# Patient Record
Sex: Male | Born: 1950 | Race: White | Hispanic: Yes | Marital: Single | Smoking: Former smoker
Health system: Southern US, Community
[De-identification: ages and names within clinical notes are randomized; demographics above are authoritative.]

## PROBLEM LIST (undated history)

## (undated) DIAGNOSIS — K759 Inflammatory liver disease, unspecified: Secondary | ICD-10-CM

## (undated) DIAGNOSIS — K7460 Unspecified cirrhosis of liver: Secondary | ICD-10-CM

## (undated) DIAGNOSIS — M47812 Spondylosis without myelopathy or radiculopathy, cervical region: Secondary | ICD-10-CM

## (undated) DIAGNOSIS — M1990 Unspecified osteoarthritis, unspecified site: Secondary | ICD-10-CM

## (undated) DIAGNOSIS — I10 Essential (primary) hypertension: Secondary | ICD-10-CM

## (undated) HISTORY — DX: Essential (primary) hypertension: I10

## (undated) HISTORY — PX: FEMUR FRACTURE SURGERY: SHX633

---

## 2001-10-12 ENCOUNTER — Emergency Department (HOSPITAL_COMMUNITY): Admission: EM | Admit: 2001-10-12 | Discharge: 2001-10-12 | Payer: Self-pay | Admitting: Emergency Medicine

## 2001-10-12 ENCOUNTER — Encounter: Payer: Self-pay | Admitting: Emergency Medicine

## 2001-10-22 ENCOUNTER — Emergency Department (HOSPITAL_COMMUNITY): Admission: EM | Admit: 2001-10-22 | Discharge: 2001-10-22 | Payer: Self-pay

## 2009-01-26 ENCOUNTER — Inpatient Hospital Stay (HOSPITAL_COMMUNITY): Admission: EM | Admit: 2009-01-26 | Discharge: 2009-01-30 | Payer: Self-pay | Admitting: Emergency Medicine

## 2009-09-10 ENCOUNTER — Ambulatory Visit (HOSPITAL_COMMUNITY): Admission: RE | Admit: 2009-09-10 | Discharge: 2009-09-10 | Payer: Self-pay | Admitting: Specialist

## 2011-02-17 LAB — URINALYSIS, ROUTINE W REFLEX MICROSCOPIC
Bilirubin Urine: NEGATIVE
Glucose, UA: NEGATIVE mg/dL
Glucose, UA: NEGATIVE mg/dL
Hgb urine dipstick: NEGATIVE
Ketones, ur: NEGATIVE mg/dL
Ketones, ur: NEGATIVE mg/dL
Nitrite: POSITIVE — AB
Protein, ur: NEGATIVE mg/dL
pH: 6 (ref 5.0–8.0)
pH: 6 (ref 5.0–8.0)

## 2011-02-17 LAB — CBC
HCT: 38.6 % — ABNORMAL LOW (ref 39.0–52.0)
MCV: 94.9 fL (ref 78.0–100.0)
RBC: 4.07 MIL/uL — ABNORMAL LOW (ref 4.22–5.81)
WBC: 6 10*3/uL (ref 4.0–10.5)

## 2011-02-17 LAB — COMPREHENSIVE METABOLIC PANEL
AST: 39 U/L — ABNORMAL HIGH (ref 0–37)
Albumin: 3.3 g/dL — ABNORMAL LOW (ref 3.5–5.2)
Alkaline Phosphatase: 84 U/L (ref 39–117)
CO2: 30 mEq/L (ref 19–32)
Chloride: 107 mEq/L (ref 96–112)
Creatinine, Ser: 0.73 mg/dL (ref 0.4–1.5)
GFR calc Af Amer: >60 mL/min (ref >60)
GFR calc non Af Amer: >60 mL/min (ref >60)
Potassium: 4.9 mEq/L (ref 3.5–5.1)
Total Bilirubin: 0.4 mg/dL (ref 0.3–1.2)

## 2011-02-17 LAB — DIFFERENTIAL
Basophils Absolute: 0 10*3/uL (ref 0.0–0.1)
Basophils Relative: 0 % (ref 0–1)
Eosinophils Absolute: 0.1 10*3/uL (ref 0.0–0.7)
Eosinophils Relative: 2 % (ref 0–5)
Lymphocytes Relative: 26 % (ref 12–46)
Monocytes Absolute: 0.5 10*3/uL (ref 0.1–1.0)

## 2011-02-17 LAB — PROTIME-INR: Prothrombin Time: 12.5 seconds (ref 11.6–15.2)

## 2011-02-17 LAB — URINE MICROSCOPIC-ADD ON

## 2011-02-17 LAB — URINE CULTURE: Colony Count: >=100000

## 2011-02-17 LAB — TYPE AND SCREEN
ABO/RH(D): O POS
Antibody Screen: NEGATIVE

## 2011-02-17 LAB — ABO/RH: ABO/RH(D): O POS

## 2011-02-24 LAB — CBC
HCT: 28.6 % — ABNORMAL LOW (ref 39.0–52.0)
HCT: 31.6 % — ABNORMAL LOW (ref 39.0–52.0)
Hemoglobin: 12.6 g/dL — ABNORMAL LOW (ref 13.0–17.0)
MCHC: 36 g/dL (ref 30.0–36.0)
MCV: 93.5 fL (ref 78.0–100.0)
MCV: 94.8 fL (ref 78.0–100.0)
MCV: 94.5 fL (ref 78.0–100.0)
Platelets: 100 10*3/uL — ABNORMAL LOW (ref 150–400)
Platelets: 103 10*3/uL — ABNORMAL LOW (ref 150–400)
Platelets: 110 10*3/uL — ABNORMAL LOW (ref 150–400)
Platelets: 119 10*3/uL — ABNORMAL LOW (ref 150–400)
RBC: 3.41 MIL/uL — ABNORMAL LOW (ref 4.22–5.81)
RBC: 3.76 MIL/uL — ABNORMAL LOW (ref 4.22–5.81)
RDW: 13.1 % (ref 11.5–15.5)
RDW: 13.1 % (ref 11.5–15.5)
WBC: 4 10*3/uL (ref 4.0–10.5)
WBC: 4.7 10*3/uL (ref 4.0–10.5)
WBC: 7.6 10*3/uL (ref 4.0–10.5)

## 2011-02-24 LAB — DIFFERENTIAL
Eosinophils Absolute: 0 10*3/uL (ref 0.0–0.7)
Lymphs Abs: 0.7 10*3/uL (ref 0.7–4.0)
Monocytes Absolute: 0.5 10*3/uL (ref 0.1–1.0)
Monocytes Relative: 7 % (ref 3–12)
Neutro Abs: 6.3 10*3/uL (ref 1.7–7.7)
Neutrophils Relative %: 84 % — ABNORMAL HIGH (ref 43–77)

## 2011-02-24 LAB — PROTIME-INR
INR: 1.3 (ref 0.00–1.49)
INR: 1 (ref 0.00–1.49)
Prothrombin Time: 14.2 seconds (ref 11.6–15.2)
Prothrombin Time: 16.5 seconds — ABNORMAL HIGH (ref 11.6–15.2)

## 2011-02-24 LAB — BASIC METABOLIC PANEL
BUN: 8 mg/dL (ref 6–23)
BUN: 8 mg/dL (ref 6–23)
BUN: 9 mg/dL (ref 6–23)
CO2: 27 mEq/L (ref 19–32)
Calcium: 8.3 mg/dL — ABNORMAL LOW (ref 8.4–10.5)
Calcium: 8.5 mg/dL (ref 8.4–10.5)
Chloride: 105 mEq/L (ref 96–112)
Chloride: 107 mEq/L (ref 96–112)
Creatinine, Ser: 0.77 mg/dL (ref 0.4–1.5)
Creatinine, Ser: 0.89 mg/dL (ref 0.4–1.5)
Creatinine, Ser: 1 mg/dL (ref 0.4–1.5)
GFR calc non Af Amer: >60 mL/min (ref >60)
GFR calc non Af Amer: >60 mL/min (ref >60)
Glucose, Bld: 87 mg/dL (ref 70–99)
Glucose, Bld: 93 mg/dL (ref 70–99)
Potassium: 3.9 mEq/L (ref 3.5–5.1)

## 2011-02-24 LAB — ABO/RH: ABO/RH(D): O POS

## 2011-02-24 LAB — POCT I-STAT, CHEM 8
Calcium, Ion: 1.03 mmol/L — ABNORMAL LOW (ref 1.12–1.32)
HCT: 36 % — ABNORMAL LOW (ref 39.0–52.0)
Hemoglobin: 12.2 g/dL — ABNORMAL LOW (ref 13.0–17.0)
TCO2: 20 mmol/L (ref 0–100)

## 2011-02-24 LAB — TYPE AND SCREEN: Antibody Screen: NEGATIVE

## 2011-02-24 LAB — APTT: aPTT: 25 seconds (ref 24–37)

## 2011-02-24 LAB — HEMOGLOBIN AND HEMATOCRIT, BLOOD: Hemoglobin: 11.5 g/dL — ABNORMAL LOW (ref 13.0–17.0)

## 2011-02-24 LAB — PREPARE RBC (CROSSMATCH)
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
## 2011-03-29 NOTE — Op Note (Signed)
NAME:  Fernando Ryan, Fernando Ryan NO.:  [id number]   MEDICAL RECORD NO.:  [id number]           PATIENT TYPE:   LOCATION:                                 FACILITY:   PHYSICIAN:  Jene Every, M.D.         DATE OF BIRTH:   DATE OF PROCEDURE:  DATE OF DISCHARGE:                               OPERATIVE REPORT   PREOPERATIVE DIAGNOSIS:  Comminuted midshaft closed femur fracture.   POSTOPERATIVE DIAGNOSIS:  Comminuted midshaft closed femur fracture.   PROCEDURE:  Open reduction and internal fixation with intramedullary  rodding of the right femur due to difficulty enhanced by the patient's  previous hip fracture and proximal femur fracture status post open  reduction and internal fixation of the hip.   ANESTHESIA:  General.   ASSISTANT:  Roma Schanz, PA   BRIEF HISTORY:  This is a 60 year old was has had a history of femur  fracture, treated with ORIF at [facility] with subsequent plate removal  today, slipped, twisted his leg pain, sustained a fracture in the mid  shaft to femur, low energy, seen in the emergency room, deformity noted,  noted to have a comminuted midshaft femur fracture closed as indicated  by intramedullary rodding.  I have discussed the risks and benefits  including bleeding, infection, DVT, PE, inability to form the rod due to  his previous minor deformity.  Some of the AP and lateral plane, he had  some symmetry of the proximal femur; however, it felt that would be  amenable to at least intramedullary rodding.   TECHNIQUE:  The patient was placed in supine position.  After induction  of adequate general anesthesia, 2 g Kefzol to the right hip and leg and  peritrochanteric region was prepped and draped in usual sterile fashion.  This was after he was placed in the fracture table, fracture reduced,  held in traction, internal rotation at the appropriate leg length.  This  was confirmed by x-ray, judging the fracture pattern from the AP and the  lateral.  Next, the well-leg was in general abduction, flexion, and  external rotation.  Peroneal post well-padded.  The incision just  proximal to the trochanter, subcutaneous tissue was dissected,  coagulated to achieve hemostasis.  The fascia lata was identified,  divided in line of the skin incision.  We identified the tip of the  trochanter, placed the guidepin on that tip.  The AP and lateral plane  centered it over the canal, advanced in the end of the canal.  This was  satisfactory in the AP and lateral plane.  We then overreamed this  proximally.  We used the DePuy trochanteric femoral rod.  We used a  reduction tool.  We also used a reduction tool for the intramedullary  canal, placed a guide pin after the reduction tool was placed, and  guided the guide pin across the fracture site down into the distal femur  at the flare.  This was confirmed in the AP and lateral plane with  reduction held with the F2, we sequentially reamed that to a 12.5 mm  with good cortical purchase obtained.  We selected an 59, we felt that  was best of the slight deformity and we felt increasing it to a 13 would  put him at risk for over reaming.  We used a DePuy nail.  We measured  the length at a 38 and then inserted this without difficulty, rotating  it biplanar, it was inserted across the fracture site with fracture site  held.  Traction was slightly released.  He was held in adduction  position.  It was seated satisfactorily without difficulty.  This was  seated to the tip of the trochanter.  We then used a proximal locking  screw guide, drilled from the greater trochanter to the lesser  trochanter, measured it at a 90, inserted the screw with excellent  purchase.  Again this was with rotation appropriately correct distally.  We placed a distal locking screw on the static locking screw hole.  This  was in the AP and lateral plane, made a small incision laterally, used a  radiolucent guide and  drilled it through the distal femur and the  locking screw was verified in the AP and lateral plane measured to a 52,  inserted the screw with excellent purchase in the AP and lateral plane,  it was found to be seated satisfactorily.  Fracture was reduced, in  terms of length the butterfly fragment anteriorly was better position,  but at the appropriate length.  We obtained AP and lateral at the hip,  fracture site, and at the distal femur with appropriate length noted.  Wound copiously irrigated.  Electrocautery was utilized to achieve  strict hemostasis.  We released the external lineman jig in the AP and  lateral planes satisfactory with no femoral neck fracture.  Under  rotation, moved as 1 unit.  We closed the fascia lata with #1 Vicryl and  figure-of-eight sutures.  Subcutaneous tissue reapproximated with 2-0  subcuticular closure.  Skin was reapproximated with staples.  Wound was  dressed sterilely and also irrigated the wound prior to this and  irrigated distally and closed it with staples.  Wound dressed sterilely.  He was removed from the fracture table, extubated without difficulty,  and transported to recovery room in satisfactory addition.   The patient tolerated the procedure well.  No complications.   ASSISTANT:  Roma Schanz, PA   ESTIMATED BLOOD LOSS:  250 mL.      Jene Every, M.D.  Electronically Signed     JB/MEDQ  D:  01/26/2009  T:  01/27/2009  Job:  [id number]

## 2011-04-01 NOTE — Discharge Summary (Signed)
NAME:  Fernando Ryan, Fernando Ryan NO.:  [id number]   MEDICAL RECORD NO.:  [id number]          PATIENT TYPE:  INP   LOCATION:  5003                         FACILITY:  [facility]   PHYSICIAN:  Jene Every, M.D.    DATE OF BIRTH:  [birth year]   DATE OF ADMISSION:  01/26/2009  DATE OF DISCHARGE:  01/30/2009                               DISCHARGE SUMMARY   ADMISSION DIAGNOSIS:  Right distal femur fracture.   DISCHARGE DIAGNOSES:  1. Right distal femur fracture, status post intramedullary nailing of      the right femur.  2. Resolved postoperative blood loss anemia.   HISTORY:  Fernando Ryan is a pleasant 60 year old gentleman who fell while  working on an uneven ground.  He tripped and twisted his right lower  extremity with immediate onset of pain and deformity.  He was brought to  [facility] Emergency Room where x-rays revealed a comminuted right  distal femur fracture.  The patient had a previous ORIF of the femur in  1996 with hardware removal.  This was done at [facility].  Since  then, he has noted no problems.  At this point, the patient will need  stabilization of the fracture.  Dr. Shelle Iron recommend placement of IM  nail.  The risks and benefits of this were discussed with the patient.  He does wish to proceed.   PROCEDURE:  The patient was taken to the OR, underwent IM nailing of the  right femur.   SURGEON:  Jene Every, M.D.   ASSISTANT:  Roma Schanz, P.A.   ANESTHESIA:  General.   COMPLICATIONS:  None.   ESTIMATED BLOOD LOSS:  200 mL.   CONSULTS:  PT/OT Care Management.   LABORATORY:  Preoperative CBC showed a white cell count 7.6, hemoglobin  12.6, hematocrit 35.5.  This was monitored closely throughout the  hospital stay.  White cell count remained normal.  Hemoglobin dropped to  a level of 8.9 with a hematocrit 25.  It was felt the patient needed  blood transfusion.  He was transfused 2 units of packed red blood cells.  At the time of  discharge, hemoglobin was stable at 11.2, hematocrit  31.6.  Preop coagulation studies reveal PT of 13.5, INR 1.0, PTT of 25.  The patient was placed on Coumadin postoperatively for DVT prophylaxis.  At the time of discharge, he had an INR of 1.6.  The patient was  discharged home with Lovenox supplement until he had an INR greater than  2.  Routine chemistries done preoperatively showed a sodium of 136,  potassium 4.1, glucose of 107, normal BUN and creatinine.  These were  monitored throughout the hospital course and remained stable.  The  patient's GFR is greater than 60, blood type is O+.  Preop chest x-ray  showed sinus rhythm.  Postoperative films of the right femur showed good  placement of hardware.   HOSPITAL COURSE:  The patient was admitted, taken to the OR, underwent  the above-stated procedure without difficulty.  He was then transferred  to the PACU and then to the orthopedic floor for continued postoperative  care.  The patient was placed on Coumadin for DVT prophylaxis.  PCI for  pain relief.  On postoperative day #1, the patient was doing fairly  well.  Pain was controlled.  Vital signs were stable.  He was afebrile.  Hemoglobin was stable.  There is mild bloody drainage at proximal  incision, some swelling in the right thigh.  Calf was nontender without  evidence of DVT.  On postoperative day #1, we did discontinue the Foley.  PCA was discontinued.  The patient was started on p.o. analgesics slowly  and incentive spirometer was then brought to the room and encouraged  that the patient is nicotine dependent.  PT/OT was initiated.  Nonweightbearing on the right lower extremity.  Postop day #2, the  patient continued to do well.  Pain was controlled on p.o. analgesics.  He continued without any chest pain or shortness of breath.  He denied  any dizziness, INR is 1.3, hemoglobin 9.2, hematocrit 25.6.  He  continued to have moderate edema in the right thigh, but this area  was  nontender.  He had good sensation.  Dressing was changed.  Incision was  clean, dry, with no evidence of infection.  PT/OT was continued.  The  patient continued to do well, but unfortunately, hemoglobin did drop to  a level of 8.9, hematocrit 25.0.  So at this point, he would benefit  from blood transfusion.  The patient is agreeable.  He was transfused 2  units of packed red blood cells.  Otherwise, he continued to do well.  On postop day #4, the patient was doing very well, feeling much better  following the blood transfusion.  He was voiding and had bowel movement.  He noted minimal pain.  He had decreased swelling in the right thigh.  Motor and neurovascular function was intact.  Hemoglobin was stable at  11.2, hematocrit 31.6.  He had an INR of 1.6 secondary to his INR not  being therapeutic, we did place him on Lovenox until he had an INR of  greater than 2.  The patient was felt to be stable and discharged home  with home health PT/OT.  The patient is to follow up with Dr. Shelle Iron in  approximately 10 days for suture removal and x-ray.  Wound care to  change his dressing daily.  It is okay for him to shower.   ACTIVITIES:  Nonweightbearing on the right lower extremity.  He is to  continue with ice and elevation.  Diet is as tolerated.   DISCHARGE MEDICATIONS:  Vitamin C 500 mg daily.  Coumadin as dosed per  pharmacy, a multivitamin with iron, Norco 5/325 1-2 p.o. q.4-6 h. p.r.n.  pain, over-the-counter stool softener, Robaxin 500 mg 1 p.o. q.8 h.  p.r.n. spasm.   The patient will continue with knee immobilizer when out of bed, it is  okay for range of motion of the knee.  We advised against smoking.   CONDITION ON DISCHARGE:  Stable.   FINAL DIAGNOSIS:  Doing well status post IM nailing of the right femur.      Roma Schanz, P.A.      Jene Every, M.D.  Electronically Signed    CS/MEDQ  D:  03/20/2009  T:  03/21/2009  Job:  [id number]

## 2018-02-06 DIAGNOSIS — I951 Orthostatic hypotension: Secondary | ICD-10-CM | POA: Diagnosis not present

## 2018-02-06 DIAGNOSIS — R69 Illness, unspecified: Secondary | ICD-10-CM | POA: Diagnosis not present

## 2018-02-06 DIAGNOSIS — Z8249 Family history of ischemic heart disease and other diseases of the circulatory system: Secondary | ICD-10-CM | POA: Diagnosis not present

## 2018-02-06 DIAGNOSIS — R030 Elevated blood-pressure reading, without diagnosis of hypertension: Secondary | ICD-10-CM | POA: Diagnosis not present

## 2018-02-06 DIAGNOSIS — Z720 Tobacco use: Secondary | ICD-10-CM | POA: Diagnosis not present

## 2018-10-31 DIAGNOSIS — H5210 Myopia, unspecified eye: Secondary | ICD-10-CM | POA: Diagnosis not present

## 2018-10-31 DIAGNOSIS — H25813 Combined forms of age-related cataract, bilateral: Secondary | ICD-10-CM | POA: Diagnosis not present

## 2018-12-18 DIAGNOSIS — Z01818 Encounter for other preprocedural examination: Secondary | ICD-10-CM | POA: Diagnosis not present

## 2018-12-18 DIAGNOSIS — H2511 Age-related nuclear cataract, right eye: Secondary | ICD-10-CM | POA: Diagnosis not present

## 2018-12-18 DIAGNOSIS — H401132 Primary open-angle glaucoma, bilateral, moderate stage: Secondary | ICD-10-CM | POA: Diagnosis not present

## 2019-01-15 DIAGNOSIS — R69 Illness, unspecified: Secondary | ICD-10-CM | POA: Diagnosis not present

## 2019-01-15 DIAGNOSIS — H401112 Primary open-angle glaucoma, right eye, moderate stage: Secondary | ICD-10-CM | POA: Diagnosis not present

## 2019-01-15 DIAGNOSIS — R2231 Localized swelling, mass and lump, right upper limb: Secondary | ICD-10-CM | POA: Diagnosis not present

## 2019-01-15 DIAGNOSIS — I10 Essential (primary) hypertension: Secondary | ICD-10-CM | POA: Diagnosis not present

## 2019-01-15 DIAGNOSIS — Z79899 Other long term (current) drug therapy: Secondary | ICD-10-CM | POA: Diagnosis not present

## 2019-01-15 DIAGNOSIS — Z125 Encounter for screening for malignant neoplasm of prostate: Secondary | ICD-10-CM | POA: Diagnosis not present

## 2019-01-21 DIAGNOSIS — R748 Abnormal levels of other serum enzymes: Secondary | ICD-10-CM | POA: Diagnosis not present

## 2019-01-21 DIAGNOSIS — R7989 Other specified abnormal findings of blood chemistry: Secondary | ICD-10-CM | POA: Diagnosis not present

## 2019-01-21 DIAGNOSIS — D696 Thrombocytopenia, unspecified: Secondary | ICD-10-CM | POA: Diagnosis not present

## 2019-01-21 DIAGNOSIS — D649 Anemia, unspecified: Secondary | ICD-10-CM | POA: Diagnosis not present

## 2019-01-22 DIAGNOSIS — R748 Abnormal levels of other serum enzymes: Secondary | ICD-10-CM | POA: Diagnosis not present

## 2019-01-22 DIAGNOSIS — K828 Other specified diseases of gallbladder: Secondary | ICD-10-CM | POA: Diagnosis not present

## 2019-01-23 DIAGNOSIS — D1722 Benign lipomatous neoplasm of skin and subcutaneous tissue of left arm: Secondary | ICD-10-CM | POA: Diagnosis not present

## 2019-01-29 DIAGNOSIS — B1920 Unspecified viral hepatitis C without hepatic coma: Secondary | ICD-10-CM | POA: Diagnosis not present

## 2019-01-29 DIAGNOSIS — D649 Anemia, unspecified: Secondary | ICD-10-CM | POA: Diagnosis not present

## 2019-01-29 DIAGNOSIS — R7989 Other specified abnormal findings of blood chemistry: Secondary | ICD-10-CM | POA: Diagnosis not present

## 2019-01-29 DIAGNOSIS — Z136 Encounter for screening for cardiovascular disorders: Secondary | ICD-10-CM | POA: Diagnosis not present

## 2019-01-29 DIAGNOSIS — I10 Essential (primary) hypertension: Secondary | ICD-10-CM | POA: Diagnosis not present

## 2019-03-28 DIAGNOSIS — D649 Anemia, unspecified: Secondary | ICD-10-CM | POA: Diagnosis not present

## 2019-03-28 DIAGNOSIS — I10 Essential (primary) hypertension: Secondary | ICD-10-CM | POA: Diagnosis not present

## 2019-03-28 DIAGNOSIS — B1920 Unspecified viral hepatitis C without hepatic coma: Secondary | ICD-10-CM | POA: Diagnosis not present

## 2019-06-10 DIAGNOSIS — D696 Thrombocytopenia, unspecified: Secondary | ICD-10-CM | POA: Diagnosis not present

## 2019-06-10 DIAGNOSIS — R768 Other specified abnormal immunological findings in serum: Secondary | ICD-10-CM | POA: Diagnosis not present

## 2019-06-10 DIAGNOSIS — B182 Chronic viral hepatitis C: Secondary | ICD-10-CM | POA: Diagnosis not present

## 2019-06-10 DIAGNOSIS — R7989 Other specified abnormal findings of blood chemistry: Secondary | ICD-10-CM | POA: Diagnosis not present

## 2019-06-11 ENCOUNTER — Other Ambulatory Visit: Payer: Self-pay | Admitting: Nurse Practitioner

## 2019-06-11 DIAGNOSIS — B182 Chronic viral hepatitis C: Secondary | ICD-10-CM

## 2019-06-26 DIAGNOSIS — Z139 Encounter for screening, unspecified: Secondary | ICD-10-CM | POA: Diagnosis not present

## 2019-06-26 DIAGNOSIS — I10 Essential (primary) hypertension: Secondary | ICD-10-CM | POA: Diagnosis not present

## 2019-06-26 DIAGNOSIS — Z1331 Encounter for screening for depression: Secondary | ICD-10-CM | POA: Diagnosis not present

## 2019-06-26 DIAGNOSIS — B1920 Unspecified viral hepatitis C without hepatic coma: Secondary | ICD-10-CM | POA: Diagnosis not present

## 2019-06-26 DIAGNOSIS — R7989 Other specified abnormal findings of blood chemistry: Secondary | ICD-10-CM | POA: Diagnosis not present

## 2019-06-26 DIAGNOSIS — Z9181 History of falling: Secondary | ICD-10-CM | POA: Diagnosis not present

## 2019-06-27 ENCOUNTER — Other Ambulatory Visit: Payer: Self-pay

## 2019-07-04 DIAGNOSIS — R7989 Other specified abnormal findings of blood chemistry: Secondary | ICD-10-CM | POA: Diagnosis not present

## 2019-07-05 DIAGNOSIS — H401112 Primary open-angle glaucoma, right eye, moderate stage: Secondary | ICD-10-CM | POA: Diagnosis not present

## 2019-07-05 DIAGNOSIS — Z01818 Encounter for other preprocedural examination: Secondary | ICD-10-CM | POA: Diagnosis not present

## 2019-07-05 DIAGNOSIS — H2511 Age-related nuclear cataract, right eye: Secondary | ICD-10-CM | POA: Diagnosis not present

## 2019-07-10 ENCOUNTER — Other Ambulatory Visit: Payer: Self-pay

## 2019-07-16 DIAGNOSIS — E119 Type 2 diabetes mellitus without complications: Secondary | ICD-10-CM | POA: Diagnosis not present

## 2019-07-16 DIAGNOSIS — B182 Chronic viral hepatitis C: Secondary | ICD-10-CM | POA: Diagnosis not present

## 2019-07-16 DIAGNOSIS — K766 Portal hypertension: Secondary | ICD-10-CM | POA: Diagnosis not present

## 2019-07-16 DIAGNOSIS — D696 Thrombocytopenia, unspecified: Secondary | ICD-10-CM | POA: Diagnosis not present

## 2019-07-16 DIAGNOSIS — K7469 Other cirrhosis of liver: Secondary | ICD-10-CM | POA: Diagnosis not present

## 2019-07-17 DIAGNOSIS — Z125 Encounter for screening for malignant neoplasm of prostate: Secondary | ICD-10-CM | POA: Diagnosis not present

## 2019-07-17 DIAGNOSIS — Z1211 Encounter for screening for malignant neoplasm of colon: Secondary | ICD-10-CM | POA: Diagnosis not present

## 2019-07-17 DIAGNOSIS — Z0000 Encounter for general adult medical examination without abnormal findings: Secondary | ICD-10-CM | POA: Diagnosis not present

## 2019-07-17 DIAGNOSIS — Z9181 History of falling: Secondary | ICD-10-CM | POA: Diagnosis not present

## 2019-07-17 DIAGNOSIS — Z1331 Encounter for screening for depression: Secondary | ICD-10-CM | POA: Diagnosis not present

## 2019-07-23 DIAGNOSIS — R69 Illness, unspecified: Secondary | ICD-10-CM | POA: Diagnosis not present

## 2019-07-23 DIAGNOSIS — Z79899 Other long term (current) drug therapy: Secondary | ICD-10-CM | POA: Diagnosis not present

## 2019-07-23 DIAGNOSIS — R002 Palpitations: Secondary | ICD-10-CM | POA: Diagnosis not present

## 2019-07-23 DIAGNOSIS — H401112 Primary open-angle glaucoma, right eye, moderate stage: Secondary | ICD-10-CM | POA: Diagnosis not present

## 2019-07-23 DIAGNOSIS — I10 Essential (primary) hypertension: Secondary | ICD-10-CM | POA: Diagnosis not present

## 2019-07-23 DIAGNOSIS — H259 Unspecified age-related cataract: Secondary | ICD-10-CM | POA: Diagnosis not present

## 2019-07-23 DIAGNOSIS — H2511 Age-related nuclear cataract, right eye: Secondary | ICD-10-CM | POA: Diagnosis not present

## 2019-08-06 DIAGNOSIS — Z23 Encounter for immunization: Secondary | ICD-10-CM | POA: Diagnosis not present

## 2019-08-06 DIAGNOSIS — Z87891 Personal history of nicotine dependence: Secondary | ICD-10-CM | POA: Diagnosis not present

## 2019-08-06 DIAGNOSIS — Z2821 Immunization not carried out because of patient refusal: Secondary | ICD-10-CM | POA: Diagnosis not present

## 2019-08-27 DIAGNOSIS — H401120 Primary open-angle glaucoma, left eye, stage unspecified: Secondary | ICD-10-CM | POA: Diagnosis not present

## 2019-08-27 DIAGNOSIS — H401122 Primary open-angle glaucoma, left eye, moderate stage: Secondary | ICD-10-CM | POA: Diagnosis not present

## 2019-08-27 DIAGNOSIS — Z8679 Personal history of other diseases of the circulatory system: Secondary | ICD-10-CM | POA: Diagnosis not present

## 2019-08-27 DIAGNOSIS — I10 Essential (primary) hypertension: Secondary | ICD-10-CM | POA: Diagnosis not present

## 2019-08-27 DIAGNOSIS — Z79899 Other long term (current) drug therapy: Secondary | ICD-10-CM | POA: Diagnosis not present

## 2019-08-27 DIAGNOSIS — H259 Unspecified age-related cataract: Secondary | ICD-10-CM | POA: Diagnosis not present

## 2019-08-27 DIAGNOSIS — H2512 Age-related nuclear cataract, left eye: Secondary | ICD-10-CM | POA: Diagnosis not present

## 2019-09-25 DIAGNOSIS — H524 Presbyopia: Secondary | ICD-10-CM | POA: Diagnosis not present

## 2019-10-21 DIAGNOSIS — R69 Illness, unspecified: Secondary | ICD-10-CM | POA: Diagnosis not present

## 2019-10-30 ENCOUNTER — Ambulatory Visit
Admission: RE | Admit: 2019-10-30 | Discharge: 2019-10-30 | Disposition: A | Discharge status: Home / Self Care | Payer: Medicare HMO | Source: Ambulatory Visit | Attending: Nurse Practitioner | Admitting: Nurse Practitioner

## 2019-10-30 DIAGNOSIS — B182 Chronic viral hepatitis C: Secondary | ICD-10-CM

## 2019-10-30 DIAGNOSIS — R69 Illness, unspecified: Secondary | ICD-10-CM | POA: Diagnosis not present

## 2019-10-30 DIAGNOSIS — K7460 Unspecified cirrhosis of liver: Secondary | ICD-10-CM | POA: Diagnosis not present

## 2019-10-30 DIAGNOSIS — K8020 Calculus of gallbladder without cholecystitis without obstruction: Secondary | ICD-10-CM | POA: Diagnosis not present

## 2019-10-31 ENCOUNTER — Other Ambulatory Visit: Payer: Self-pay | Admitting: Nurse Practitioner

## 2019-10-31 DIAGNOSIS — K7469 Other cirrhosis of liver: Secondary | ICD-10-CM | POA: Diagnosis not present

## 2019-10-31 DIAGNOSIS — D1330 Benign neoplasm of unspecified part of small intestine: Secondary | ICD-10-CM

## 2019-10-31 DIAGNOSIS — D379 Neoplasm of uncertain behavior of digestive organ, unspecified: Secondary | ICD-10-CM | POA: Diagnosis not present

## 2019-10-31 DIAGNOSIS — R69 Illness, unspecified: Secondary | ICD-10-CM | POA: Diagnosis not present

## 2019-11-22 ENCOUNTER — Ambulatory Visit: Payer: Medicare HMO | Admitting: Gastroenterology

## 2019-12-04 ENCOUNTER — Inpatient Hospital Stay: Admission: RE | Admit: 2019-12-04 | Payer: Medicare HMO | Source: Ambulatory Visit

## 2019-12-28 ENCOUNTER — Other Ambulatory Visit: Payer: Self-pay

## 2019-12-28 ENCOUNTER — Ambulatory Visit
Admission: RE | Admit: 2019-12-28 | Discharge: 2019-12-28 | Disposition: A | Discharge status: Home / Self Care | Payer: Medicare HMO | Source: Ambulatory Visit | Attending: Nurse Practitioner | Admitting: Nurse Practitioner

## 2019-12-28 DIAGNOSIS — D379 Neoplasm of uncertain behavior of digestive organ, unspecified: Secondary | ICD-10-CM

## 2019-12-28 DIAGNOSIS — D1330 Benign neoplasm of unspecified part of small intestine: Secondary | ICD-10-CM

## 2019-12-28 MED ORDER — GADOBENATE DIMEGLUMINE 529 MG/ML IV SOLN
15.0000 mL | Freq: Once | INTRAVENOUS | Status: AC | PRN
  Administered 2019-12-28: 11:00:00 15 mL via INTRAVENOUS

## 2019-12-31 ENCOUNTER — Other Ambulatory Visit (INDEPENDENT_AMBULATORY_CARE_PROVIDER_SITE_OTHER): Payer: Medicare HMO

## 2019-12-31 ENCOUNTER — Ambulatory Visit: Payer: Medicare HMO | Admitting: Gastroenterology

## 2019-12-31 ENCOUNTER — Other Ambulatory Visit: Payer: Self-pay

## 2019-12-31 ENCOUNTER — Encounter: Payer: Self-pay | Admitting: Gastroenterology

## 2019-12-31 VITALS — BP 160/74 | HR 75 | Temp 97.6°F | Ht 65.0 in | Wt 164.1 lb

## 2019-12-31 DIAGNOSIS — K7030 Alcoholic cirrhosis of liver without ascites: Secondary | ICD-10-CM

## 2019-12-31 DIAGNOSIS — C220 Liver cell carcinoma: Secondary | ICD-10-CM

## 2019-12-31 DIAGNOSIS — B1920 Unspecified viral hepatitis C without hepatic coma: Secondary | ICD-10-CM | POA: Diagnosis not present

## 2019-12-31 LAB — COMPREHENSIVE METABOLIC PANEL
ALT: 21 U/L (ref 0–53)
AST: 34 U/L (ref 0–37)
Albumin: 4.2 g/dL (ref 3.5–5.2)
Alkaline Phosphatase: 86 U/L (ref 39–117)
BUN: 23 mg/dL (ref 6–23)
CO2: 27 mEq/L (ref 19–32)
Calcium: 10 mg/dL (ref 8.4–10.5)
Chloride: 105 mEq/L (ref 96–112)
Creatinine, Ser: 0.91 mg/dL (ref 0.40–1.50)
GFR: 82.44 mL/min (ref >60.00)
Glucose, Bld: 83 mg/dL (ref 70–99)
Potassium: 4.6 mEq/L (ref 3.5–5.1)
Sodium: 137 mEq/L (ref 135–145)
Total Bilirubin: 0.5 mg/dL (ref 0.2–1.2)
Total Protein: 8.3 g/dL (ref 6.0–8.3)

## 2019-12-31 LAB — CBC WITH DIFFERENTIAL/PLATELET
Basophils Absolute: 0 10*3/uL (ref 0.0–0.1)
Basophils Relative: 0.4 % (ref 0.0–3.0)
Eosinophils Absolute: 0.2 10*3/uL (ref 0.0–0.7)
Eosinophils Relative: 3 % (ref 0.0–5.0)
HCT: 34 % — ABNORMAL LOW (ref 39.0–52.0)
Hemoglobin: 11.9 g/dL — ABNORMAL LOW (ref 13.0–17.0)
Lymphocytes Relative: 44.1 % (ref 12.0–46.0)
Lymphs Abs: 2.3 10*3/uL (ref 0.7–4.0)
MCHC: 35 g/dL (ref 30.0–36.0)
MCV: 92.5 fl (ref 78.0–100.0)
Monocytes Absolute: 0.4 10*3/uL (ref 0.1–1.0)
Monocytes Relative: 8.2 % (ref 3.0–12.0)
Neutro Abs: 2.3 10*3/uL (ref 1.4–7.7)
Neutrophils Relative %: 44.3 % (ref 43.0–77.0)
Platelets: 61 10*3/uL — ABNORMAL LOW (ref 150.0–400.0)
RBC: 3.68 Mil/uL — ABNORMAL LOW (ref 4.22–5.81)
RDW: 13.6 % (ref 11.5–15.5)
WBC: 5.3 10*3/uL (ref 4.0–10.5)

## 2019-12-31 LAB — PROTIME-INR
INR: 1.2 ratio — ABNORMAL HIGH (ref 0.8–1.0)
Prothrombin Time: 12.9 s (ref 9.6–13.1)

## 2019-12-31 MED ORDER — SUPREP BOWEL PREP KIT 17.5-3.13-1.6 GM/177ML PO SOLN: 1.0000 | Freq: Once | ORAL | 0 refills | Status: AC

## 2019-12-31 NOTE — H&P (View-Only) (Signed)
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  Denies any drug abuse or use of hearing. Hepatitis C s/p treatment with Epclusa, recently completed 12 weeks therapy.  Never had EGD or colonoscopy  Denies any nausea, vomiting, abdominal pain, melena or bright red blood per rectum   MRI abdomen 12/28/2019: Paraesophageal varices, multiple small gallstones, cirrhosis, right hepatic lobe posterior lesion with arterial enhancement and washout consistent with hepatocellular carcinoma 2.1 X 1.7 cm..  Pancreatic lymph nodes near the pancreatic head.  Mild splenomegaly  Abdominal ultrasound with elastography October 30, 2019: 4.6 cm solid mass along the posterior aspect of the pancreatic head possibly reflecting pancreatic lesion versus peripancreatic node.  Outpatient Encounter Medications as of 12/31/2019  Medication Sig  . Cyanocobalamin (VITAMIN B 12) 500 MCG TABS Take by mouth. 1 tablet 3 times per week  . levothyroxine (SYNTHROID) 25 MCG tablet Take 25 mcg by mouth daily.  Marland Kitchen lisinopril (ZESTRIL) 10 MG tablet Take 10 mg by mouth daily.  . Multiple Vitamin (MULTIVITAMIN) tablet Take 1 tablet by mouth daily.   No facility-administered encounter medications on file as of 12/31/2019.    Allergies as of 12/31/2019  . (No Known Allergies)    Past Medical History:  Diagnosis Date  . Hypertension     Past Surgical History:  Procedure Laterality Date  . FEMUR FRACTURE SURGERY      Family History  Problem Relation Age of Onset  .  Diabetes Mother   . Heart disease Mother   . Heart disease Father   . CAD Brother   . Colon cancer Neg Hx   . Esophageal cancer Neg Hx   . Pancreatic cancer Neg Hx   . Stomach cancer Neg Hx     Social History   Socioeconomic History  . Marital status: Single    Spouse name: Not on file  . Number of children: Not on file  . Years of education: Not on file  . Highest education level: Not on file  Occupational History  . Not on file  Tobacco Use  . Smoking status: Current Every Day Smoker    Types: Cigarettes  . Smokeless tobacco: Never Used  Substance and Sexual Activity  . Alcohol use: Not Currently  . Drug use: Not Currently  . Sexual activity: Not on file  Other Topics Concern  . Not on file  Social History Narrative  . Not on file   Social Determinants of Health   Financial Resource Strain:   . Difficulty of Paying Living Expenses: Not on file  Food Insecurity:   . Worried About Charity fundraiser in the Last Year: Not on file  . Ran Out of Food in the Last Year: Not on file  Transportation Needs:   . Lack of Transportation (Medical): Not on file  . Lack of Transportation (Non-Medical): Not on file  Physical Activity:   . Days of Exercise per Week: Not on file  . Minutes of Exercise per Session:  Not on file  Stress:   . Feeling of Stress : Not on file  Social Connections:   . Frequency of Communication with Friends and Family: Not on file  . Frequency of Social Gatherings with Friends and Family: Not on file  . Attends Religious Services: Not on file  . Active Member of Clubs or Organizations: Not on file  . Attends Archivist Meetings: Not on file  . Marital Status: Not on file  Intimate Partner Violence:   . Fear of Current or Ex-Partner: Not on file  . Emotionally Abused: Not on file  . Physically Abused: Not on file  . Sexually Abused: Not on file      Review of systems: All other review of systems negative except as mentioned in the  HPI.   Physical Exam: Vitals:   12/31/19 1442  BP: (!) 160/74  Pulse: 75  Temp: 97.6 F (36.4 C)  SpO2: 98%   Body mass index is 27.31 kg/m. Gen:      No acute distress HEENT:  EOMI, sclera anicteric Abd:      + bowel sounds; soft, non-tender; no palpable masses, no distension Ext:    No edema; adequate peripheral perfusion Skin:      Warm and dry; no rash Neuro: alert and oriented x 3.  No asterixis Psych: normal mood and affect  Data Reviewed:  Reviewed labs, radiology imaging, old records and pertinent past GI work up   Assessment and Plan/Recommendations:  69 year old male with history of EtOH and hepatitis C cirrhosis s/p treatment with 12 weeks of Epclusa.  He is abstaining from alcohol, has not had any in the past >4 months  MRI abdomen with and without contrast concerning for hepatocellular carcinoma, definite per radiology criteria 2.1 X 1.7 cm in the posterior right hepatic lobe. No pancreatic mass but has 1.4 cm peripancreatic lymph node, likely reactive per radiologist  Will send urgent referral to interventional radiology for consult, may have to consider biopsy of the liver lesion if there is any concern of possible secondary liver metastatic lesion versus primary [place].  He will also need evaluation to discuss possible local regional therapies for [facility] with ablation or TACE  No recent labs in epic to calculate meld  Check CBC, CMP, PT and INR  No evidence of volume overload or hepatic encephalopathy  Check CA 19-9, CEA and AFP Send referral to oncology  Follow-up with [facility] liver clinic to discuss liver transplant if the liver lesion is indeed primary hepatocellular carcinoma  He never had EGD for esophageal varices screening and also never had colorectal cancer screening.  We will schedule for EGD along with colonoscopy. The risks and benefits as well as alternatives of endoscopic procedure(s) have been discussed and reviewed. All questions answered. The  patient agrees to proceed.    The patient was provided an opportunity to ask questions and all were answered. The patient agreed with the plan and demonstrated an understanding of the instructions.  Damaris Hippo , MD    CC: Earlyne Iba, NP

## 2019-12-31 NOTE — Progress Notes (Signed)
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  Denies any drug abuse or use of hearing. Hepatitis C s/p treatment with Epclusa, recently completed 12 weeks therapy.  Never had EGD or colonoscopy  Denies any nausea, vomiting, abdominal pain, melena or bright red blood per rectum   MRI abdomen 12/28/2019: Paraesophageal varices, multiple small gallstones, cirrhosis, right hepatic lobe posterior lesion with arterial enhancement and washout consistent with hepatocellular carcinoma 2.1 X 1.7 cm..  Pancreatic lymph nodes near the pancreatic head.  Mild splenomegaly  Abdominal ultrasound with elastography October 30, 2019: 4.6 cm solid mass along the posterior aspect of the pancreatic head possibly reflecting pancreatic lesion versus peripancreatic node.  Outpatient Encounter Medications as of 12/31/2019  Medication Sig  . Cyanocobalamin (VITAMIN B 12) 500 MCG TABS Take by mouth. 1 tablet 3 times per week  . levothyroxine (SYNTHROID) 25 MCG tablet Take 25 mcg by mouth daily.  Marland Kitchen lisinopril (ZESTRIL) 10 MG tablet Take 10 mg by mouth daily.  . Multiple Vitamin (MULTIVITAMIN) tablet Take 1 tablet by mouth daily.   No facility-administered encounter medications on file as of 12/31/2019.    Allergies as of 12/31/2019  . (No Known Allergies)    Past Medical History:  Diagnosis Date  . Hypertension     Past Surgical History:  Procedure Laterality Date  . FEMUR FRACTURE SURGERY      Family History  Problem Relation Age of Onset  .  Diabetes Mother   . Heart disease Mother   . Heart disease Father   . CAD Brother   . Colon cancer Neg Hx   . Esophageal cancer Neg Hx   . Pancreatic cancer Neg Hx   . Stomach cancer Neg Hx     Social History   Socioeconomic History  . Marital status: Single    Spouse name: Not on file  . Number of children: Not on file  . Years of education: Not on file  . Highest education level: Not on file  Occupational History  . Not on file  Tobacco Use  . Smoking status: Current Every Day Smoker    Types: Cigarettes  . Smokeless tobacco: Never Used  Substance and Sexual Activity  . Alcohol use: Not Currently  . Drug use: Not Currently  . Sexual activity: Not on file  Other Topics Concern  . Not on file  Social History Narrative  . Not on file   Social Determinants of Health   Financial Resource Strain:   . Difficulty of Paying Living Expenses: Not on file  Food Insecurity:   . Worried About Charity fundraiser in the Last Year: Not on file  . Ran Out of Food in the Last Year: Not on file  Transportation Needs:   . Lack of Transportation (Medical): Not on file  . Lack of Transportation (Non-Medical): Not on file  Physical Activity:   . Days of Exercise per Week: Not on file  . Minutes of Exercise per Session:  Not on file  Stress:   . Feeling of Stress : Not on file  Social Connections:   . Frequency of Communication with Friends and Family: Not on file  . Frequency of Social Gatherings with Friends and Family: Not on file  . Attends Religious Services: Not on file  . Active Member of Clubs or Organizations: Not on file  . Attends Archivist Meetings: Not on file  . Marital Status: Not on file  Intimate Partner Violence:   . Fear of Current or Ex-Partner: Not on file  . Emotionally Abused: Not on file  . Physically Abused: Not on file  . Sexually Abused: Not on file      Review of systems: All other review of systems negative except as mentioned in the  HPI.   Physical Exam: Vitals:   12/31/19 1442  BP: (!) 160/74  Pulse: 75  Temp: 97.6 F (36.4 C)  SpO2: 98%   Body mass index is 27.31 kg/m. Gen:      No acute distress HEENT:  EOMI, sclera anicteric Abd:      + bowel sounds; soft, non-tender; no palpable masses, no distension Ext:    No edema; adequate peripheral perfusion Skin:      Warm and dry; no rash Neuro: alert and oriented x 3.  No asterixis Psych: normal mood and affect  Data Reviewed:  Reviewed labs, radiology imaging, old records and pertinent past GI work up   Assessment and Plan/Recommendations:  69 year old male with history of EtOH and hepatitis C cirrhosis s/p treatment with 12 weeks of Epclusa.  He is abstaining from alcohol, has not had any in the past >4 months  MRI abdomen with and without contrast concerning for hepatocellular carcinoma, definite per radiology criteria 2.1 X 1.7 cm in the posterior right hepatic lobe. No pancreatic mass but has 1.4 cm peripancreatic lymph node, likely reactive per radiologist  Will send urgent referral to interventional radiology for consult, may have to consider biopsy of the liver lesion if there is any concern of possible secondary liver metastatic lesion versus primary [place].  He will also need evaluation to discuss possible local regional therapies for [facility] with ablation or TACE  No recent labs in epic to calculate meld  Check CBC, CMP, PT and INR  No evidence of volume overload or hepatic encephalopathy  Check CA 19-9, CEA and AFP Send referral to oncology  Follow-up with [facility] liver clinic to discuss liver transplant if the liver lesion is indeed primary hepatocellular carcinoma  He never had EGD for esophageal varices screening and also never had colorectal cancer screening.  We will schedule for EGD along with colonoscopy. The risks and benefits as well as alternatives of endoscopic procedure(s) have been discussed and reviewed. All questions answered. The  patient agrees to proceed.    The patient was provided an opportunity to ask questions and all were answered. The patient agreed with the plan and demonstrated an understanding of the instructions.  Damaris Hippo , MD    CC: Earlyne Iba, NP

## 2019-12-31 NOTE — Patient Instructions (Addendum)
Go to the basement today for labs  Due to recent COVID-19 restrictions implemented by our local and state authorities and in an effort to keep both patients and staff as safe as possible, our hospital system now requires COVID-19 testing prior to any scheduled hospital procedure. Please go to our [facility] location drive thru testing site ([address], [place], [place] [address]) on 01/03/2020  at  1:05pm. There will be multiple testing areas, the first checkpoint being for pre-procedure/surgery testing. Get into the right (yellow) lane that leads to the PAT testing team. You will not be billed at the time of testing but may receive a bill later depending on your insurance. The approximate cost of the test is $100. You must agree to quarantine from the time of your testing until the procedure date on 2/23 . This should include staying at home with ONLY the people you live with. Avoid take-out, grocery store shopping or leaving the house for any non-emergent reason. Failure to have your COVID-19 test done on the date and time you have been scheduled will result in cancellation of procedure. Please call our office at [phone] if you have any questions.   You have been scheduled for a colonoscopy/Endoscopy for [facility] on 01/07/2020, separate instructions have been given  We will refer you to Interventional Radiology for a liver biopsy and they will contact you with that appointment  (Call daughter at [phone])  We will also refer you to oncology, they will call you with that appointment  You will need to follow up with [facility] for liver transplant evaluation   If you are age 70 or older, your body mass index should be between 23-30. Your Body mass index is 27.31 kg/m. If this is out of the aforementioned range listed, please consider follow up with your Primary Care Provider.  If you are age 2 or younger, your body mass index should be between 19-25. Your Body mass  index is 27.31 kg/m. If this is out of the aformentioned range listed, please consider follow up with your Primary Care Provider.    I appreciate the  opportunity to care for you  Thank You   Harl Bowie , MD

## 2020-01-01 LAB — CANCER ANTIGEN 19-9: CA 19-9: 5 U/mL (ref <34)

## 2020-01-01 LAB — CEA: CEA: 12.9 ng/mL — ABNORMAL HIGH

## 2020-01-01 LAB — AFP TUMOR MARKER: AFP-Tumor Marker: 5.3 ng/mL (ref <6.1)

## 2020-01-02 ENCOUNTER — Encounter: Payer: Self-pay | Admitting: Gastroenterology

## 2020-01-03 ENCOUNTER — Inpatient Hospital Stay (HOSPITAL_COMMUNITY): Admission: RE | Admit: 2020-01-03 | Payer: Medicare HMO | Source: Ambulatory Visit

## 2020-01-06 ENCOUNTER — Other Ambulatory Visit: Payer: Self-pay | Admitting: Gastroenterology

## 2020-01-06 ENCOUNTER — Other Ambulatory Visit: Payer: Self-pay

## 2020-01-06 DIAGNOSIS — I8500 Esophageal varices without bleeding: Secondary | ICD-10-CM

## 2020-01-06 DIAGNOSIS — Z1211 Encounter for screening for malignant neoplasm of colon: Secondary | ICD-10-CM

## 2020-01-07 ENCOUNTER — Ambulatory Visit (HOSPITAL_COMMUNITY): Payer: Medicare HMO | Admitting: Anesthesiology

## 2020-01-07 ENCOUNTER — Encounter (HOSPITAL_COMMUNITY): Payer: Self-pay | Admitting: Gastroenterology

## 2020-01-07 ENCOUNTER — Ambulatory Visit (HOSPITAL_COMMUNITY)
Admission: RE | Admit: 2020-01-07 | Discharge: 2020-01-07 | Disposition: A | Discharge status: Home / Self Care | Payer: Medicare HMO | Attending: Gastroenterology | Admitting: Gastroenterology

## 2020-01-07 ENCOUNTER — Other Ambulatory Visit: Payer: Self-pay

## 2020-01-07 ENCOUNTER — Encounter (HOSPITAL_COMMUNITY)
Admission: RE | Disposition: A | Discharge status: Home / Self Care | Payer: Self-pay | Source: Home / Self Care | Attending: Gastroenterology

## 2020-01-07 DIAGNOSIS — I8500 Esophageal varices without bleeding: Secondary | ICD-10-CM

## 2020-01-07 DIAGNOSIS — I8510 Secondary esophageal varices without bleeding: Secondary | ICD-10-CM | POA: Diagnosis not present

## 2020-01-07 DIAGNOSIS — K8020 Calculus of gallbladder without cholecystitis without obstruction: Secondary | ICD-10-CM | POA: Diagnosis not present

## 2020-01-07 DIAGNOSIS — D649 Anemia, unspecified: Secondary | ICD-10-CM | POA: Diagnosis not present

## 2020-01-07 DIAGNOSIS — K5730 Diverticulosis of large intestine without perforation or abscess without bleeding: Secondary | ICD-10-CM | POA: Diagnosis not present

## 2020-01-07 DIAGNOSIS — K3182 Dieulafoy lesion (hemorrhagic) of stomach and duodenum: Secondary | ICD-10-CM | POA: Diagnosis not present

## 2020-01-07 DIAGNOSIS — Z8249 Family history of ischemic heart disease and other diseases of the circulatory system: Secondary | ICD-10-CM | POA: Insufficient documentation

## 2020-01-07 DIAGNOSIS — F17210 Nicotine dependence, cigarettes, uncomplicated: Secondary | ICD-10-CM | POA: Diagnosis not present

## 2020-01-07 DIAGNOSIS — D124 Benign neoplasm of descending colon: Secondary | ICD-10-CM | POA: Insufficient documentation

## 2020-01-07 DIAGNOSIS — I10 Essential (primary) hypertension: Secondary | ICD-10-CM | POA: Diagnosis not present

## 2020-01-07 DIAGNOSIS — K635 Polyp of colon: Secondary | ICD-10-CM

## 2020-01-07 DIAGNOSIS — I864 Gastric varices: Secondary | ICD-10-CM | POA: Diagnosis not present

## 2020-01-07 DIAGNOSIS — Z833 Family history of diabetes mellitus: Secondary | ICD-10-CM | POA: Insufficient documentation

## 2020-01-07 DIAGNOSIS — K7030 Alcoholic cirrhosis of liver without ascites: Secondary | ICD-10-CM | POA: Insufficient documentation

## 2020-01-07 DIAGNOSIS — K648 Other hemorrhoids: Secondary | ICD-10-CM | POA: Insufficient documentation

## 2020-01-07 DIAGNOSIS — Z1211 Encounter for screening for malignant neoplasm of colon: Secondary | ICD-10-CM | POA: Insufficient documentation

## 2020-01-07 DIAGNOSIS — E039 Hypothyroidism, unspecified: Secondary | ICD-10-CM | POA: Diagnosis not present

## 2020-01-07 DIAGNOSIS — R161 Splenomegaly, not elsewhere classified: Secondary | ICD-10-CM | POA: Diagnosis not present

## 2020-01-07 DIAGNOSIS — Z79899 Other long term (current) drug therapy: Secondary | ICD-10-CM | POA: Diagnosis not present

## 2020-01-07 DIAGNOSIS — B1920 Unspecified viral hepatitis C without hepatic coma: Secondary | ICD-10-CM

## 2020-01-07 DIAGNOSIS — D120 Benign neoplasm of cecum: Secondary | ICD-10-CM | POA: Insufficient documentation

## 2020-01-07 HISTORY — PX: HEMOSTASIS CLIP PLACEMENT: SHX6857

## 2020-01-07 HISTORY — PX: ESOPHAGOGASTRODUODENOSCOPY (EGD) WITH PROPOFOL: SHX5813

## 2020-01-07 HISTORY — PX: POLYPECTOMY: SHX5525

## 2020-01-07 HISTORY — PX: COLONOSCOPY WITH PROPOFOL: SHX5780

## 2020-01-07 LAB — SARS CORONAVIRUS 2 (TAT 6-24 HRS): SARS Coronavirus 2: NEGATIVE

## 2020-01-07 SURGERY — COLONOSCOPY WITH PROPOFOL
Anesthesia: Monitor Anesthesia Care

## 2020-01-07 MED ORDER — SODIUM CHLORIDE 0.9 % IV SOLN: INTRAVENOUS | Status: DC

## 2020-01-07 MED ORDER — PROPOFOL 500 MG/50ML IV EMUL
INTRAVENOUS | Status: DC | PRN
  Administered 2020-01-07: 150 ug/kg/min via INTRAVENOUS

## 2020-01-07 MED ORDER — LACTATED RINGERS IV SOLN
INTRAVENOUS | Status: DC
  Administered 2020-01-07: 1000 mL via INTRAVENOUS

## 2020-01-07 MED ORDER — LIDOCAINE HCL 1 % IJ SOLN
INTRAMUSCULAR | Status: DC | PRN
  Administered 2020-01-07: 100 mg via INTRADERMAL

## 2020-01-07 MED ORDER — NADOLOL 20 MG PO TABS: 20.0000 mg | ORAL_TABLET | Freq: Every day | ORAL | 11 refills | Status: DC

## 2020-01-07 SURGICAL SUPPLY — 25 items

## 2020-01-07 NOTE — Op Note (Signed)
[facility] Patient Name: Fernando Ryan Procedure Date: 01/07/2020 MRN: [id number] Attending MD: Mauri Pole , MD Date of Birth: 1951/08/31 CSN: [id number] Age: 69 Admit Type: Outpatient Procedure:                Colonoscopy Indications:              Screening for colorectal malignant neoplasm Providers:                Mauri Pole, MD, Cleda Daub, RN, Cletis Athens, Technician, Karis Juba, CRNA Referring MD:              Medicines:                Monitored Anesthesia Care Complications:            No immediate complications. Estimated Blood Loss:     Estimated blood loss was minimal. Procedure:                Pre-Anesthesia Assessment:                           - Prior to the procedure, a History and Physical                            was performed, and patient medications and                            allergies were reviewed. The patient's tolerance of                            previous anesthesia was also reviewed. The risks                            and benefits of the procedure and the sedation                            options and risks were discussed with the patient.                            All questions were answered, and informed consent                            was obtained. Prior Anticoagulants: The patient has                            taken no previous anticoagulant or antiplatelet                            agents. ASA Grade Assessment: III - A patient with                            severe systemic disease. After reviewing the risks  and benefits, the patient was deemed in                            satisfactory condition to undergo the procedure.                           After obtaining informed consent, the colonoscope                            was passed under direct vision. Throughout the                            procedure, the patient's blood pressure, pulse,  and                            oxygen saturations were monitored continuously. The                            [id number]-[id number]) Olympus pediatric colonscope                            was introduced through the anus and advanced to the                            the cecum, identified by appendiceal orifice and                            ileocecal valve. The colonoscopy was performed                            without difficulty. The patient tolerated the                            procedure well. The quality of the bowel                            preparation was excellent. The ileocecal valve,                            appendiceal orifice, and rectum were photographed. Scope In: 12:18:08 PM Scope Out: 12:34:37 PM Scope Withdrawal Time: 0 hours 10 minutes 46 seconds  Total Procedure Duration: 0 hours 16 minutes 29 seconds  Findings:      The perianal and digital rectal examinations were normal.      A 7 mm polyp was found in the cecum. The polyp was sessile. The polyp       was removed with a cold snare. Resection and retrieval were complete.      A 16 mm polyp was found in the descending colon. The polyp was       pedunculated. The polyp was removed with a hot snare. Resection and       retrieval were complete.      Scattered small and large-mouthed diverticula were found in the sigmoid       colon, descending colon, transverse colon and ascending colon.      Non-bleeding  internal hemorrhoids were found during retroflexion. The       hemorrhoids were small. Impression:               - One 7 mm polyp in the cecum, removed with a cold                            snare. Resected and retrieved.                           - One 16 mm polyp in the descending colon, removed                            with a hot snare. Resected and retrieved.                           - Moderate diverticulosis in the sigmoid colon, in                            the descending colon, in the transverse colon  and                            in the ascending colon.                           - Non-bleeding internal hemorrhoids. Moderate Sedation:      Not Applicable - Patient had care per Anesthesia. Recommendation:           - Patient has a contact number available for                            emergencies. The signs and symptoms of potential                            delayed complications were discussed with the                            patient. Return to normal activities tomorrow.                            Written discharge instructions were provided to the                            patient.                           - Resume previous diet.                           - Continue present medications.                           - Await pathology results.                           - Repeat colonoscopy in 3 years for surveillance  based on pathology results.                           - Refer to an interventional radiologist at                            appointment to be scheduled for liver biopsy                            (?Primary HCC vs metastatic pancreatic Ca).                           - Refer to an oncologist at appointment to be                            scheduled soon (patient wants to follow with                            Oncologist here and doesnt want to go to [facility]).                           - Follow up with [facility] Dreznik, liver clinic. Procedure Code(s):        --- Professional ---                           [phone], Colonoscopy, flexible; with removal of                            tumor(s), polyp(s), or other lesion(s) by snare                            technique Diagnosis Code(s):        --- Professional ---                           Z12.11, Encounter for screening for malignant                            neoplasm of colon                           K63.5, Polyp of colon                           K64.8, Other hemorrhoids                            K57.30, Diverticulosis of large intestine without                            perforation or abscess without bleeding CPT copyright 2019 American Medical Association. All rights reserved. The codes documented in this report are preliminary and upon coder review may  be revised to meet current compliance requirements. Mauri Pole, MD 01/07/2020 12:53:40 PM This report has been signed electronically. Number of Addenda: 0

## 2020-01-07 NOTE — Op Note (Signed)
[facility] Patient Name: Fernando Ryan Procedure Date: 01/07/2020 MRN: [id number] Attending MD: Mauri Pole , MD Date of Birth: [birth year] CSN: [id number] Age: 69 Admit Type: Outpatient Procedure:                Upper GI endoscopy Indications:              To evaluate esophageal varices in patient with                            suspected portal hypertension Providers:                Mauri Pole, MD, Cleda Daub, RN, Cletis Athens, Technician, Karis Juba, CRNA Referring MD:              Medicines:                Monitored Anesthesia Care Complications:            No immediate complications. Estimated Blood Loss:     Estimated blood loss was minimal. Procedure:                Pre-Anesthesia Assessment:                           - Prior to the procedure, a History and Physical                            was performed, and patient medications and                            allergies were reviewed. The patient's tolerance of                            previous anesthesia was also reviewed. The risks                            and benefits of the procedure and the sedation                            options and risks were discussed with the patient.                            All questions were answered, and informed consent                            was obtained. Prior Anticoagulants: The patient has                            taken no previous anticoagulant or antiplatelet                            agents. ASA Grade Assessment: III - A patient with  severe systemic disease. After reviewing the risks                            and benefits, the patient was deemed in                            satisfactory condition to undergo the procedure.                           After obtaining informed consent, the endoscope was                            passed under direct vision. Throughout the                    procedure, the patient's blood pressure, pulse, and                            oxygen saturations were monitored continuously. The                            [id number]-H190 [id number]) Olympus gastroscope was                            introduced through the mouth, and advanced to the                            second part of duodenum. The upper GI endoscopy was                            accomplished without difficulty. The patient                            tolerated the procedure well. Scope In: Scope Out: Findings:      The Z-line was regular and was found 36 cm from the incisors.      Three columns of grade II varices were found in the lower third of the       esophagus,. They were less than 5 mm in largest diameter. No red wale       signs were present.      Type 1 isolated gastric varices (IGV1, varices located in the fundus)       with no bleeding were found in the gastric fundus. There were no       stigmata of recent bleeding. They were less than 5 mm in largest       diameter.      A Dieulafoy lesion with oozing bleeding and stigmata of recent bleeding       was found at the incisura. To stop active bleeding, one hemostatic clip       was successfully placed (MR conditional). There was no bleeding at the       end of the procedure.      The examined duodenum was normal. Impression:               - Z-line regular, 36 cm from the incisors.                           -  Grade II small esophageal varices.                           - Small Type 1 isolated gastric varices (IGV1,                            varices located in the fundus), without bleeding.                           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                           - Normal examined duodenum.                           - No specimens collected. Moderate Sedation:      Not Applicable - Patient had care per Anesthesia. Recommendation:           -  Patient has a contact number available for                            emergencies. The signs and symptoms of potential                            delayed complications were discussed with the                            patient. Return to normal activities tomorrow.                            Written discharge instructions were provided to the                            patient.                           - Resume previous diet.                           - Continue present medications.                           - Repeat upper endoscopy in 1 year for surveillance.                           - Start Nadolol 20mg  ( beta blocker) daily                           - See the other procedure note for documentation of                            additional recommendations. Procedure Code(s):        --- Professional ---  43255, Esophagogastroduodenoscopy, flexible,                            transoral; with control of bleeding, any method Diagnosis Code(s):        --- Professional ---                           I85.00, Esophageal varices without bleeding                           I86.4, Gastric varices                           K31.82, Dieulafoy lesion (hemorrhagic) of stomach                            and duodenum CPT copyright 2019 American Medical Association. All rights reserved. The codes documented in this report are preliminary and upon coder review may  be revised to meet current compliance requirements. Mauri Pole, MD 01/07/2020 12:48:27 PM This report has been signed electronically. Number of Addenda: 0

## 2020-01-07 NOTE — Interval H&P Note (Signed)
History and Physical Interval Note:  01/07/2020 11:24 AM  Fernando Ryan  has presented today for surgery, with the diagnosis of cirrhosis  Hep C  colo rectal screaning   screening for varices.  The various methods of treatment have been discussed with the patient and family. After consideration of risks, benefits and other options for treatment, the patient has consented to  Procedure(s): COLONOSCOPY WITH PROPOFOL (N/A) ESOPHAGOGASTRODUODENOSCOPY (EGD) WITH PROPOFOL (N/A) as a surgical intervention.  The patient's history has been reviewed, patient examined, no change in status, stable for surgery.  I have reviewed the patient's chart and labs.  Questions were answered to the patient's satisfaction.     [name]

## 2020-01-07 NOTE — Progress Notes (Signed)
Pt went to [facility] pathology yesterday for covid test. Results are negative PCR. Paper copy in chart. Showed results to Dr. Doroteo Glassman , she was good with results. Pt and daughter called and will come to hospital

## 2020-01-07 NOTE — Transfer of Care (Signed)
Immediate Anesthesia Transfer of Care Note  Patient: Fernando Ryan  Procedure(s) Performed: COLONOSCOPY WITH PROPOFOL (N/A ) ESOPHAGOGASTRODUODENOSCOPY (EGD) WITH PROPOFOL (N/A ) HEMOSTASIS CLIP PLACEMENT POLYPECTOMY  Patient Location: PACU and Endoscopy Unit  Anesthesia Type:MAC  Level of Consciousness: awake, alert , oriented and patient cooperative  Airway & Oxygen Therapy: Patient Spontanous Breathing and Patient connected to face mask oxygen  Post-op Assessment: Report given to RN and Post -op Vital signs reviewed and stable  Post vital signs: Reviewed and stable  Last Vitals:  Vitals Value Taken Time  BP 102/55 01/07/20 1246  Temp    Pulse 67 01/07/20 1248  Resp 17 01/07/20 1248  SpO2 100 % 01/07/20 1248  Vitals shown include unvalidated device data.  Last Pain:  Vitals:   01/07/20 1140  TempSrc: Oral  PainSc: 0-No pain         Complications: No apparent anesthesia complications

## 2020-01-07 NOTE — Anesthesia Preprocedure Evaluation (Signed)
Anesthesia Evaluation  Patient identified by MRN, date of birth, ID band Patient awake    Reviewed: Allergy & Precautions, NPO status , Patient's Chart, lab work & pertinent test results  Airway Mallampati: II  TM Distance: >3 FB Neck ROM: Full    Dental no notable dental hx.    Pulmonary Current Smoker,    Pulmonary exam normal breath sounds clear to auscultation       Cardiovascular hypertension, Pt. on medications negative cardio ROS Normal cardiovascular exam Rhythm:Regular Rate:Normal     Neuro/Psych negative neurological ROS  negative psych ROS   GI/Hepatic (+) Cirrhosis     substance abuse  alcohol use, Hepatitis -, CScreening endoscopy for varices given EtOH abuse Screening colonoscopy    Endo/Other  Hypothyroidism   Renal/GU negative Renal ROS  negative genitourinary   Musculoskeletal negative musculoskeletal ROS (+)   Abdominal Normal abdominal exam  (+)   Peds negative pediatric ROS (+)  Hematology  (+) Blood dyscrasia, anemia , hct 34, plt 61   Anesthesia Other Findings   Reproductive/Obstetrics negative OB ROS                             Anesthesia Physical Anesthesia Plan  ASA: III  Anesthesia Plan: MAC   Post-op Pain Management:    Induction:   PONV Risk Score and Plan: 2 and Propofol infusion and TIVA  Airway Management Planned: Natural Airway and Simple Face Mask  Additional Equipment: None  Intra-op Plan:   Post-operative Plan:   Informed Consent: I have reviewed the patients History and Physical, chart, labs and discussed the procedure including the risks, benefits and alternatives for the proposed anesthesia with the patient or authorized representative who has indicated his/her understanding and acceptance.       Plan Discussed with: CRNA  Anesthesia Plan Comments:         Anesthesia Quick Evaluation

## 2020-01-07 NOTE — Anesthesia Postprocedure Evaluation (Signed)
Anesthesia Post Note  Patient: Kenley Troop  Procedure(s) Performed: COLONOSCOPY WITH PROPOFOL (N/A ) ESOPHAGOGASTRODUODENOSCOPY (EGD) WITH PROPOFOL (N/A ) HEMOSTASIS CLIP PLACEMENT POLYPECTOMY     Patient location during evaluation: PACU Anesthesia Type: MAC Level of consciousness: awake and alert Pain management: pain level controlled Vital Signs Assessment: post-procedure vital signs reviewed and stable Respiratory status: spontaneous breathing, nonlabored ventilation and respiratory function stable Cardiovascular status: blood pressure returned to baseline and stable Postop Assessment: no apparent nausea or vomiting Anesthetic complications: no    Last Vitals:  Vitals:   01/07/20 1247 01/07/20 1256  BP: (!) 102/55 (!) 110/51  Pulse: 68 64  Resp: 19 19  Temp: 36.4 C   SpO2: 100% 100%    Last Pain:  Vitals:   01/07/20 1256  TempSrc:   PainSc: 0-No pain                 Jarome Matin Aidyn Sportsman

## 2020-01-07 NOTE — Discharge Instructions (Signed)
YOU HAD AN ENDOSCOPIC PROCEDURE TODAY: Refer to the procedure report and other information in the discharge instructions given to you for any specific questions about what was found during the examination. If this information does not answer your questions, please call [facility] office at [phone] to clarify.  ° °YOU SHOULD EXPECT: Some feelings of bloating in the abdomen. Passage of more gas than usual. Walking can help get rid of the air that was put into your GI tract during the procedure and reduce the bloating. If you had a lower endoscopy (such as a colonoscopy or flexible sigmoidoscopy) you may notice spotting of blood in your stool or on the toilet paper. Some abdominal soreness may be present for a day or two, also. ° °DIET: Your first meal following the procedure should be a light meal and then it is ok to progress to your normal diet. A half-sandwich or bowl of soup is an example of a good first meal. Heavy or fried foods are harder to digest and may make you feel nauseous or bloated. Drink plenty of fluids but you should avoid alcoholic beverages for 24 hours. If you had a esophageal dilation, please see attached instructions for diet.   ° °ACTIVITY: Your care partner should take you home directly after the procedure. You should plan to take it easy, moving slowly for the rest of the day. You can resume normal activity the day after the procedure however YOU SHOULD NOT DRIVE, use power tools, machinery or perform tasks that involve climbing or major physical exertion for 24 hours (because of the sedation medicines used during the test).  ° °SYMPTOMS TO REPORT IMMEDIATELY: °A gastroenterologist can be reached at any hour. Please call [phone]  for any of the following symptoms:  °Following lower endoscopy (colonoscopy, flexible sigmoidoscopy) °Excessive amounts of blood in the stool  °Significant tenderness, worsening of abdominal pains  °Swelling of the abdomen that is new, acute  °Fever of 100° or  higher  °Following upper endoscopy (EGD, EUS, ERCP, esophageal dilation) °Vomiting of blood or coffee ground material  °New, significant abdominal pain  °New, significant chest pain or pain under the shoulder blades  °Painful or persistently difficult swallowing  °New shortness of breath  °Black, tarry-looking or red, bloody stools ° °FOLLOW UP:  °If any biopsies were taken you will be contacted by phone or by letter within the next 1-3 weeks. Call [phone]  if you have not heard about the biopsies in 3 weeks.  °Please also call with any specific questions about appointments or follow up tests. ° °

## 2020-01-08 LAB — SURGICAL PATHOLOGY

## 2020-01-09 ENCOUNTER — Encounter: Payer: Self-pay | Admitting: Gastroenterology

## 2020-01-09 ENCOUNTER — Encounter: Payer: Self-pay | Admitting: *Deleted

## 2020-01-09 ENCOUNTER — Telehealth: Payer: Self-pay | Admitting: *Deleted

## 2020-01-09 NOTE — Telephone Encounter (Signed)
Dr Silverio Decamp- Just wanted to let you know that our cancer center has attempted to reach patient several times and has left messages for patient to call back but he has not returned their call.

## 2020-01-09 NOTE — Telephone Encounter (Signed)
Ok, thank you

## 2020-01-09 NOTE — Telephone Encounter (Signed)
-----   Message from Jonnie Finner, RN sent at 01/09/2020  1:01 PM EST ----- Regarding: Patient Status Hi, I just wanted to let you know that Seth Bake our new patient scheduler has left him messages to call back and schedule an appointment but he has not returned our call.  Fernando Ryan

## 2020-01-09 NOTE — Telephone Encounter (Signed)
Can you please ask cancer coordinator to call his daughter and will be possible to list her as the main contact instead of his number?  Thank you

## 2020-01-09 NOTE — Telephone Encounter (Signed)
Patient's daughter has scheduled patient an appointment with Dr Burr Medico on Thursday, 01/16/20 at 3:00 pm.

## 2020-01-09 NOTE — Telephone Encounter (Signed)
I have spoken to [place], patient's daughter (ok to speak with per ROI) who indicates she will contact cancer center and get appointment for patient.

## 2020-01-10 NOTE — Progress Notes (Signed)
Spoke with patient on the phone to introduce myself as GI Nurse Navigator and my role in his care.  He was given the address and his appointment on 3/4 with Dr. Burr Medico at 3:00 pm.  Instructed him to arrive at least 20 minutes early to get registered.  He states he doesn't have any questions right now but I encouraged him and his daughter to write down any questions they may have and bring them with him to his appointment.  He verbalized an understanding and was very appreciative of my call.

## 2020-01-13 ENCOUNTER — Ambulatory Visit: Payer: Medicare HMO | Admitting: Nurse Practitioner

## 2020-01-16 ENCOUNTER — Inpatient Hospital Stay: Payer: Medicare HMO | Attending: Hematology | Admitting: Hematology

## 2020-01-16 DIAGNOSIS — C220 Liver cell carcinoma: Secondary | ICD-10-CM | POA: Insufficient documentation

## 2020-01-16 DIAGNOSIS — F17210 Nicotine dependence, cigarettes, uncomplicated: Secondary | ICD-10-CM | POA: Insufficient documentation

## 2020-01-16 DIAGNOSIS — K635 Polyp of colon: Secondary | ICD-10-CM | POA: Insufficient documentation

## 2020-01-16 DIAGNOSIS — I8500 Esophageal varices without bleeding: Secondary | ICD-10-CM | POA: Insufficient documentation

## 2020-01-16 DIAGNOSIS — B182 Chronic viral hepatitis C: Secondary | ICD-10-CM | POA: Insufficient documentation

## 2020-01-16 DIAGNOSIS — R161 Splenomegaly, not elsewhere classified: Secondary | ICD-10-CM | POA: Insufficient documentation

## 2020-01-16 DIAGNOSIS — Z8249 Family history of ischemic heart disease and other diseases of the circulatory system: Secondary | ICD-10-CM | POA: Insufficient documentation

## 2020-01-16 DIAGNOSIS — I864 Gastric varices: Secondary | ICD-10-CM | POA: Insufficient documentation

## 2020-01-16 DIAGNOSIS — I10 Essential (primary) hypertension: Secondary | ICD-10-CM | POA: Insufficient documentation

## 2020-01-16 DIAGNOSIS — Z79899 Other long term (current) drug therapy: Secondary | ICD-10-CM | POA: Insufficient documentation

## 2020-01-16 DIAGNOSIS — Z833 Family history of diabetes mellitus: Secondary | ICD-10-CM | POA: Insufficient documentation

## 2020-01-17 ENCOUNTER — Other Ambulatory Visit: Payer: Self-pay | Admitting: Nurse Practitioner

## 2020-01-17 ENCOUNTER — Telehealth: Payer: Self-pay | Admitting: Gastroenterology

## 2020-01-17 DIAGNOSIS — C220 Liver cell carcinoma: Secondary | ICD-10-CM

## 2020-01-17 NOTE — Telephone Encounter (Signed)
Per patient's daughter, patient "went to his liver doctor" yesterday because he also had an appointment there and didn't realize it was on the same day as his appointment with the cancer center. I advised that he does need to reschedule with the cancer center as soon as possible. Daughter, Essie Christine understanding of this.

## 2020-01-17 NOTE — Telephone Encounter (Signed)
Information given to daughter

## 2020-01-17 NOTE — Telephone Encounter (Signed)
===  View-only below this line=== ----- Message ----- From: Jonnie Finner, RN Sent: 01/17/2020  10:05 AM EST To: Larina Bras, CMA Subject: New Patient Appointment                        This patient was a NO SHOW for his appointment with Dr. Burr Medico yesterday.  We will attempt to rescheduled.  Just wanted to let you know.  Fernando Ryan

## 2020-01-17 NOTE — Telephone Encounter (Signed)
Can you please try to reach his daughter and check what happened and see if he can reschedule his appt? Thank you

## 2020-01-17 NOTE — Telephone Encounter (Signed)
Ok, thank you

## 2020-01-22 ENCOUNTER — Other Ambulatory Visit: Payer: Self-pay | Admitting: Nurse Practitioner

## 2020-01-22 ENCOUNTER — Other Ambulatory Visit: Payer: Self-pay

## 2020-01-22 DIAGNOSIS — C7800 Secondary malignant neoplasm of unspecified lung: Secondary | ICD-10-CM

## 2020-01-27 ENCOUNTER — Telehealth: Payer: Self-pay | Admitting: Hematology

## 2020-01-27 NOTE — Progress Notes (Incomplete)
?  Fernando Ryan   ?[phone] [phone]   ?Clinic New Consult Note  ? ?Patient Care Team: ?Earlyne Iba, NP as PCP - General (Nurse Practitioner) ?Jonnie Finner, RN as Oncology Nurse Navigator ? ?Date of Service:  01/30/2020  ? ?CHIEF COMPLAINTS/PURPOSE OF CONSULTATION:  ?*** ? ?REFERRING PHYSICIAN:  ?*** ? ?Oncology History Overview Note  ?Cancer Staging ?No matching staging information was found for the patient. ? ?  ?Malignant neoplasm of liver, primary ([place])  ?12/28/2019 Imaging  ? MRI abdomen 12/28/19  ?IMPRESSION: ?1. LI-RADS category LR-5 hepatocellular carcinoma measuring 2.1 by ?1.7 cm lesion posteriorly in the right hepatic lobe with arterial ?phase enhancement, washout, and capsule appearance. Background ?cirrhosis noted. ?2. No pancreatic mass is evident. There is a 1.4 cm peripancreatic ?lymph node which is likely reactive. Based on corroboration with the ?prior ultrasound, I suspect that the masslike appearance adjacent to ?the pancreatic head was due to duodenum. ?3. Several small cysts or biliary hamartomas in the liver. ?4. Clustered cystic lesions along the left hepatic lobe hilum are ?difficult to confidently separate from the biliary tree, and could ?represent type V clustered choledochal cysts. ?5. Mild splenomegaly. ?6. 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 ?  ?01/07/2020 Procedure  ? Upper endoscopy by Dr Silverio Decamp 01/07/20  ?IMPRESSION ?- Z-line regular, 36 cm from the incisors. ?- Grade II small esophageal varices. ?- Small Type 1 isolated gastric varices (IGV1, varices located in the fundus), without ?bleeding. 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 ?- Normal examined duodenum. ?- No specimens collected. ?  ?01/07/2020 Procedure  ? Colonoscopy by Dr Silverio Decamp 01/07/20 ?IMPRESSION ?- One 7 mm polyp in the cecum, removed with a cold snare. Resected and retrieved. ? - One 16 mm polyp in the descending colon, removed w

## 2020-01-27 NOTE — Telephone Encounter (Signed)
Scheduled per referral. Called and spoke with Sharyn Lull (daughter), confirmed 3/18 appt

## 2020-01-28 ENCOUNTER — Encounter: Payer: Self-pay | Admitting: *Deleted

## 2020-01-28 ENCOUNTER — Other Ambulatory Visit: Payer: Self-pay

## 2020-01-28 ENCOUNTER — Ambulatory Visit
Admission: RE | Admit: 2020-01-28 | Discharge: 2020-01-28 | Disposition: A | Discharge status: Home / Self Care | Payer: Medicare HMO | Source: Ambulatory Visit | Attending: Nurse Practitioner | Admitting: Nurse Practitioner

## 2020-01-28 DIAGNOSIS — C220 Liver cell carcinoma: Secondary | ICD-10-CM

## 2020-01-28 HISTORY — PX: IR RADIOLOGIST EVAL & MGMT: IMG5224

## 2020-01-28 NOTE — Consult Note (Signed)
Chief Complaint: Patient was consulted remotely today (TeleHealth) for hepatocellular cancer at the request of Drazek,Dawn.    Referring Physician(s): Drazek,Dawn  History of Present Illness: Fernando Ryan is a 69 y.o. 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  He has been sober since this past November 2020.  He recently completed 12-week antiviral therapy with Epclusa for his hepatitis C.  MR imaging from 12/28/2019 demonstrates a 2.1 x 1.7 cm lesion in the posterior and medial aspect of hepatic segment 6.  Lesion is consistent with a Li-RADS category 5, diagnostic for hepatocellular carcinoma.    Today, I consulted with Mr. Marsella via the telephone.  His daughter was also present on the conference call.  He is currently in his usual state of health.  He denies unintentional weight loss, abdominal pain, nausea or vomiting.  Past Medical History:  Diagnosis Date  . Hypertension     Past Surgical History:  Procedure Laterality Date  . COLONOSCOPY WITH PROPOFOL N/A 01/07/2020   Procedure: COLONOSCOPY WITH PROPOFOL;  Surgeon: Mauri Pole, MD;  Location: [facility];  Service: Endoscopy;  Laterality: N/A;  . ESOPHAGOGASTRODUODENOSCOPY (EGD) WITH PROPOFOL N/A 01/07/2020   Procedure: ESOPHAGOGASTRODUODENOSCOPY (EGD) WITH PROPOFOL;  Surgeon: Mauri Pole, MD;  Location: [facility];  Service: Endoscopy;  Laterality: N/A;  . FEMUR FRACTURE SURGERY    . HEMOSTASIS CLIP PLACEMENT  01/07/2020   Procedure: HEMOSTASIS CLIP PLACEMENT;  Surgeon: Mauri Pole, MD;  Location: [facility];  Service: Endoscopy;;  . POLYPECTOMY  01/07/2020   Procedure: POLYPECTOMY;  Surgeon: Mauri Pole, MD;   Location: [facility];  Service: Endoscopy;;    Allergies: Patient has no known allergies.  Medications: Prior to Admission medications   Medication Sig Start Date End Date Taking? Authorizing Provider  Cyanocobalamin (VITAMIN B 12) 500 MCG TABS Take 500 mg by mouth 3 (three) times a week.     [provider]  levothyroxine (SYNTHROID) 25 MCG tablet Take 25 mcg by mouth daily. 12/23/19   [provider]  lisinopril (ZESTRIL) 10 MG tablet Take 10 mg by mouth daily. 12/23/19   [provider]  Multiple Vitamin (MULTIVITAMIN) tablet Take 1 tablet by mouth daily.    [provider]  nadolol (CORGARD) 20 MG tablet Take 1 tablet (20 mg total) by mouth daily. 01/07/20 01/06/21  Mauri Pole, MD     Family History  Problem Relation Age of Onset  . Diabetes Mother   . Heart disease Mother   . Heart disease Father   . CAD Brother   . Colon cancer Neg Hx   . Esophageal cancer Neg Hx   . Pancreatic cancer Neg Hx   . Stomach cancer Neg Hx     Social History   Socioeconomic History  . Marital status: Single    Spouse name: Not on file  . Number of children: Not on file  . Years of education: Not on file  . Highest education level: Not on file  Occupational History  . Not on file  Tobacco Use  . Smoking status: Current Every Day Smoker    Types: Cigarettes  . Smokeless tobacco: Never Used  Substance and Sexual Activity  . Alcohol use: Not Currently  . Drug  use: Not Currently  . Sexual activity: Not on file  Other Topics Concern  . Not on file  Social History Narrative  . Not on file   Social Determinants of Health   Financial Resource Strain:   . Difficulty of Paying Living Expenses:   Food Insecurity:   . Worried About Charity fundraiser in the Last Year:   . Arboriculturist in the Last Year:   Transportation Needs:   . Film/video editor (Medical):   Marland Kitchen Lack of Transportation (Non-Medical):   Physical Activity:   . Days of  Exercise per Week:   . Minutes of Exercise per Session:   Stress:   . Feeling of Stress :   Social Connections:   . Frequency of Communication with Friends and Family:   . Frequency of Social Gatherings with Friends and Family:   . Attends Religious Services:   . Active Member of Clubs or Organizations:   . Attends Archivist Meetings:   Marland Kitchen Marital Status:     ECOG Status: 0 - Asymptomatic  Review of Systems  Review of Systems: A 12 point ROS discussed and pertinent positives are indicated in the HPI above.  All other systems are negative.  Physical Exam No direct physical exam was performed (except for noted visual exam findings with Video Visits).    Vital Signs: There were no vitals taken for this visit.  Imaging: No results found.  Labs:  CBC: Recent Labs    12/31/19 1554  WBC 5.3  HGB 11.9*  HCT 34.0*  PLT 61.0*    COAGS: Recent Labs    12/31/19 1554  INR 1.2*    BMP: Recent Labs    12/31/19 1554  NA 137  K 4.6  CL 105  CO2 27  GLUCOSE 83  BUN 23  CALCIUM 10.0  CREATININE 0.91    LIVER FUNCTION TESTS: Recent Labs    12/31/19 1554  BILITOT 0.5  AST 34  ALT 21  ALKPHOS 86  PROT 8.3  ALBUMIN 4.2    TUMOR MARKERS: Recent Labs    12/31/19 1554  AFPTM 5.3  CEA 12.9*  CA199 5    Assessment and Plan:  70 year old gentleman with alcoholic and HCV cirrhosis complicated by a newly diagnosed 2.1 cm hepatocellular carcinoma in the posterior and medial aspect of hepatic segment 6.  He has chronic thrombocytopenia as well.  His most recent laboratory evaluation demonstrates platelet level greater than 50,000.  Review of his MRI demonstrates a solitary 2.1 x 1.7 cm lesion.  This lesion would be amenable to percutaneous thermal ablation which has a high likelihood of achieving local control.  There are a few prominent peripancreatic lymph nodes which appear to be reactive and likely related to the patient's underlying cirrhosis.   They do not appear to represent metastatic adenopathy at this time.  I discussed percutaneous thermal ablation with microwave with the patient and his daughter.  I explained that this is a minimally invasive procedure with a relatively low recovery time.  Complications include bleeding, infection and damage to adjacent structures.  With lesions less than 3 cm in size, local control rates are excellent.  I did explain that due to the field effect of his underlying cirrhosis, the remainder of his liver will remain at risk for development of new hepatocellular carcinomas and he will require continued screening.  I was able to answer his and his daughter's questions.  They understand that the procedure will  be performed under general anesthesia.  Mr. Carrick may be able to be discharged same day, or may require overnight admission depending on how things go.  They understand and desire to proceed.  1.)  Please schedule for percutaneous thermal ablation to be performed at [facility].  Thank you for this interesting consult.  I greatly enjoyed meeting Sahir Cepin and look forward to participating in their care.  A copy of this report was sent to the requesting provider on this date.  Electronically Signed: Jacqulynn Cadet 01/28/2020, 4:27 PM   I spent a total of  40 Minutes  in remote clinical consultation, greater than 50% of which was counseling/coordinating care for hepatocellular cancer.    Visit type: Audio only (telephone). Audio (no video) only due to patient preference. Alternative for in-person consultation at [facility], [address], [place], [place]. This visit type was conducted due to national recommendations for restrictions regarding the COVID-19 Pandemic (e.g. social distancing).  This format is felt to be most appropriate for this patient at this time.  All issues noted in this document were discussed and addressed.

## 2020-01-30 ENCOUNTER — Inpatient Hospital Stay: Payer: Medicare HMO | Admitting: Hematology

## 2020-01-30 ENCOUNTER — Other Ambulatory Visit (HOSPITAL_COMMUNITY): Payer: Self-pay | Admitting: Interventional Radiology

## 2020-01-30 DIAGNOSIS — C220 Liver cell carcinoma: Secondary | ICD-10-CM

## 2020-01-30 DIAGNOSIS — C228 Malignant neoplasm of liver, primary, unspecified as to type: Secondary | ICD-10-CM | POA: Insufficient documentation

## 2020-02-04 ENCOUNTER — Telehealth: Payer: Self-pay | Admitting: Nurse Practitioner

## 2020-02-04 NOTE — Telephone Encounter (Signed)
Pt's daughter cld back to reschedule her father's appt to 3/25 at 1:45pm w/Lacie

## 2020-02-05 NOTE — Progress Notes (Addendum)
[facility]  [phone] Fax:([phone] [place] Note   Patient Care Team: Earlyne Iba, NP as PCP - General (Nurse Practitioner) Jonnie Finner, RN as Oncology Nurse Navigator 02/06/2020  CHIEF COMPLAINTS/PURPOSE OF CONSULTATION:  Hepatocellular carcinoma, referred by GI Dr. Silverio Decamp   Oncology History Overview Note  Cancer Staging No matching staging information was found for the patient.    Malignant neoplasm of liver, primary ([place])  12/28/2019 Imaging   MRI abdomen 12/28/19  IMPRESSION: 1. LI-RADS category LR-5 hepatocellular carcinoma measuring 2.1 by 1.7 cm lesion posteriorly in the right hepatic lobe with arterial phase enhancement, washout, and capsule appearance. Background cirrhosis noted. 2. No pancreatic mass is evident. There is a 1.4 cm peripancreatic lymph node which is likely reactive. Based on corroboration with the prior ultrasound, I suspect that the masslike appearance adjacent to the pancreatic head was due to duodenum. 3. Several small cysts or biliary hamartomas in the liver. 4. Clustered cystic lesions along the left hepatic lobe hilum are difficult to confidently separate from the biliary tree, and could represent type V clustered choledochal cysts. 5. Mild splenomegaly. 6. Paraesophageal varices, gastric varices, and splenorenal shunting compatible with portal venous hypertension.   01/07/2020 Procedure   Upper endoscopy by Dr Silverio Decamp 01/07/20  IMPRESSION - Z-line regular, 36 cm from the incisors. - Grade II small esophageal varices. - Small Type 1 isolated gastric varices (IGV1, varices located in the fundus), without bleeding. 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 - Normal examined duodenum. - No specimens collected.   01/07/2020 Procedure   Colonoscopy by Dr Silverio Decamp 01/07/20 IMPRESSION - One 7 mm polyp in the cecum, removed with a cold snare. Resected and retrieved. -  One 16 mm polyp in the descending colon, removed with a hot snare. Resected and retrieved. - Moderate diverticulosis in the sigmoid colon, in the descending colon, in the transverse colon and in the ascending colon. - Non-bleeding internal hemorrhoids.    01/07/2020 Pathology Results   FINAL MICROSCOPIC DIAGNOSIS: 01/07/20 A. COLON, CECUM, POLYPECTOMY:  - Tubular adenoma.  - No high-grade dysplasia or carcinoma.   B. COLON, DESCENDING, POLYPECTOMY:  - Tubular adenoma.  - No high-grade dysplasia or carcinoma.   GROSS DESCRIPTION:  A: Received in formalin is a tan, soft tissue fragment that is submitted  in toto.  Size: 0.6 cm, 1 block submitted.  B: Received in formalin is a 1.0 x 0.9 x 0.8 cm rubbery tan-red mucosal  polyp.  The specimen is inked, sectioned and entirely submitted in 1  cassette.  (GRP 01/07/2020)    01/30/2020 Initial Diagnosis   Malignant neoplasm of liver, primary ([facility])     HISTORY OF PRESENTING ILLNESS:  Fernando Ryan 69 y.o. male with history of HTN, hereditary hemochromatosis, chronic Hepatitis C s/p 12 weeks Epclusa, cirrhosis (Child Pugh class 5) and esophageal varices is here because of hepatocellular carcinoma. He is followed by Roosevelt Locks, NP of [facility]. Screening abdominal US on 10/30/19 showed nodular hepatic contour suggesting mild cirrhosis, splenomegaly, and a 4.1 x3.0 x4.6 solid mass along the posterior aspect of the pancreatic head. No focal liver lesion was seen. Follow up MRCP on 12/28/19 that showed no pancreatic lesion but a 1.4 cm peripancreatic node which was favored reactive; however there was a LR-5 lesion measuring 2.1 x1.7 posteriorly in the right hepatic lobe with arterial phase enhancement, washout, and capsule appearance in the setting of cirrhosis. This is diagnostic for hepatocellular carcinoma.  He was also found to have paraesophageal varices and gastric varices, and splenorenal shunting consistent with portal venous hypertension.  AFP on 2/16 was normal, 5.3; CA 19-9 also normal at 5. Due to elevated CEA of 12.9, he underwent colonoscopy on 01/07/20 by Dr. Janey Greaser which showed tubular adenoma in the cecum and descending colon, no high-grade dysplasia or malignancy. EGD showed grade II varices in the lower esophagus and type I gastric varices as well as a dieulafoy lesion with bleeding in the stomach which was clipped. He was referred to IR Dr. Laurence Ferrari who plans to perform percutaneous thermal ablation. He is scheduled for CT chest on 02/10/20 to complete staging.   Socially, he is single, lives alone. Independent of ADLs. Denies h/o thrombosis, heart disease, DM, or kidney disease. He works part time Animator. He has 1 daughter age 34, healthy. He has no known family history of cancer. He drank alcohol daily, around 12 drinks x40 years before he quit in 09/2019. He smoked cigarettes x45 years 1/2 PPD, he recently cut back to 3-4 per day. He denies other drug use.   Today, he presents with his daughter. He feels well. Denies fatigue or unintentional weight loss. He feels full quickly and bloated if he drinks too much protein shakes. Otherwise denies pain, n/v/c/d. He has formed BM after meals. Denies bleeding. Denies recent fever, chills, cough, chest pain, dyspnea, leg edema.   MEDICAL HISTORY:  Past Medical History:  Diagnosis Date  . Hypertension     SURGICAL HISTORY: Past Surgical History:  Procedure Laterality Date  . COLONOSCOPY WITH PROPOFOL N/A 01/07/2020   Procedure: COLONOSCOPY WITH PROPOFOL;  Surgeon: Mauri Pole, MD;  Location: [facility];  Service: Endoscopy;  Laterality: N/A;  . ESOPHAGOGASTRODUODENOSCOPY (EGD) WITH PROPOFOL N/A 01/07/2020   Procedure: ESOPHAGOGASTRODUODENOSCOPY (EGD) WITH PROPOFOL;  Surgeon: Mauri Pole, MD;  Location: [facility];  Service: Endoscopy;  Laterality: N/A;  . FEMUR FRACTURE SURGERY    . HEMOSTASIS CLIP PLACEMENT  01/07/2020   Procedure: HEMOSTASIS CLIP  PLACEMENT;  Surgeon: Mauri Pole, MD;  Location: [facility];  Service: Endoscopy;;  . IR RADIOLOGIST EVAL & MGMT  01/28/2020  . POLYPECTOMY  01/07/2020   Procedure: POLYPECTOMY;  Surgeon: Mauri Pole, MD;  Location: [facility];  Service: Endoscopy;;    SOCIAL HISTORY: Social History   Socioeconomic History  . Marital status: Single    Spouse name: Not on file  . Number of children: Not on file  . Years of education: Not on file  . Highest education level: Not on file  Occupational History  . Not on file  Tobacco Use  . Smoking status: Current Every Day Smoker    Types: Cigarettes  . Smokeless tobacco: Never Used  Substance and Sexual Activity  . Alcohol use: Not Currently  . Drug use: Not Currently  . Sexual activity: Not on file  Other Topics Concern  . Not on file  Social History Narrative  . Not on file   Social Determinants of Health   Financial Resource Strain:   . Difficulty of Paying Living Expenses:   Food Insecurity:   . Worried About Charity fundraiser in the Last Year:   . Arboriculturist in the Last Year:   Transportation Needs:   . Film/video editor (Medical):   Marland Kitchen Lack of Transportation (Non-Medical):   Physical Activity:   . Days of Exercise per Week:   . Minutes of Exercise per Session:  Stress:   . Feeling of Stress :   Social Connections:   . Frequency of Communication with Friends and Family:   . Frequency of Social Gatherings with Friends and Family:   . Attends Religious Services:   . Active Member of Clubs or Organizations:   . Attends Archivist Meetings:   Marland Kitchen Marital Status:   Intimate Partner Violence:   . Fear of Current or Ex-Partner:   . Emotionally Abused:   Marland Kitchen Physically Abused:   . Sexually Abused:     FAMILY HISTORY: Family History  Problem Relation Age of Onset  . Diabetes Mother   . Heart disease Mother   . Heart disease Father   . CAD Brother   . Colon cancer Neg Hx   . Esophageal  cancer Neg Hx   . Pancreatic cancer Neg Hx   . Stomach cancer Neg Hx     ALLERGIES:  has No Known Allergies.  MEDICATIONS:  Current Outpatient Medications  Medication Sig Dispense Refill  . Cyanocobalamin (VITAMIN B 12) 500 MCG TABS Take 500 mg by mouth 3 (three) times a week.     . levothyroxine (SYNTHROID) 25 MCG tablet Take 25 mcg by mouth daily.    Marland Kitchen lisinopril (ZESTRIL) 10 MG tablet Take 10 mg by mouth daily.    . Multiple Vitamin (MULTIVITAMIN) tablet Take 1 tablet by mouth daily.    . nadolol (CORGARD) 20 MG tablet Take 1 tablet (20 mg total) by mouth daily. 30 tablet 11   No current facility-administered medications for this visit.    REVIEW OF SYSTEMS:   Constitutional: Denies fevers, chills or abnormal night sweats Eyes: Denies blurriness of vision, double vision or watery eyes Ears, nose, mouth, throat, and face: Denies mucositis or sore throat Respiratory: Denies cough, dyspnea or wheezes Cardiovascular: Denies palpitation, chest discomfort or lower extremity swelling Gastrointestinal:  Denies nausea, heartburn or change in bowel habits (+) bloating (+) early satiety  Skin: Denies abnormal skin rashes Lymphatics: Denies new lymphadenopathy or easy bruising Neurological:Denies numbness, tingling or new weaknesses Behavioral/Psych: Mood is stable, no new changes  All other systems were reviewed with the patient and are negative.  PHYSICAL EXAMINATION: ECOG PERFORMANCE STATUS: 0 - Asymptomatic  Vitals:   02/06/20 1405  BP: (!) 144/65  Pulse: (!) 51  Temp: 98.2 F (36.8 C)  SpO2: 100%   Filed Weights   02/06/20 1405  Weight: 166 lb 4.8 oz (75.4 kg)    GENERAL:alert, no distress and comfortable SKIN: no rash  EYES:  sclera clear NECK: without mass LUNGS: clear with normal breathing effort HEART: regular rate & rhythm, no lower extremity edema ABDOMEN: abdomen soft, round, non-tender and normal bowel sounds. Splenomegaly. No hepatomegaly  PSYCH: alert &  oriented x 3 with fluent speech NEURO: no focal motor/sensory deficits  LABORATORY DATA:  No labs for this visit. I have reviewed the most recent labs below   CBC Latest Ref Rng & Units 12/31/2019 09/03/2009 01/30/2009  WBC 4.0 - 10.5 K/uL 5.3 6.0 4.7  Hemoglobin 13.0 - 17.0 g/dL 11.9(L) 13.3 11.2 POST TRANSFUSION SPECIMEN(L)  Hematocrit 39.0 - 52.0 % 34.0(L) 38.6(L) 31.6(L)  Platelets 150.0 - 400.0 K/uL 61.0(L) 141(L) 110(L)   CMP Latest Ref Rng & Units 12/31/2019 09/03/2009 01/29/2009  Glucose 70 - 99 mg/dL 83 104(H) 85  BUN 6 - 23 mg/dL 23 10 8   Creatinine 0.40 - 1.50 mg/dL 0.91 0.73 0.77  Sodium 135 - 145 mEq/L 137 142 137  Potassium 3.5 -  5.1 mEq/L 4.6 4.9 3.6  Chloride 96 - 112 mEq/L 105 107 105  CO2 19 - 32 mEq/L 27 30 29   Calcium 8.4 - 10.5 mg/dL 10.0 9.7 8.5  Total Protein 6.0 - 8.3 g/dL 8.3 7.6 -  Total Bilirubin 0.2 - 1.2 mg/dL 0.5 0.4 -  Alkaline Phos 39 - 117 U/L 86 84 -  AST 0 - 37 U/L 34 39(H) -  ALT 0 - 53 U/L 21 41 -    RADIOGRAPHIC STUDIES: I have personally reviewed the radiological images as listed and agreed with the findings in the report. IR Radiologist Eval & Mgmt  Result Date: 01/28/2020 Please refer to notes tab for details about interventional procedure. (Op Note)   ASSESSMENT & PLAN: 69 yo male with   1. Hepatocellular carcinoma -we reviewed his medical history in detail with the patient and his daughter. He has a 2.1 x1.7 cm LR-5 liver lesion with typical image findings consistent with HCC, although his AFP is normal, the diagnosis of R[place] is quite certain. A biopsy has not been recommended. There is an indeterminate 1.4 cm peripancreatic LN that warrants surveillance.   -He has been scheduled for a CT chest on 02/10/20 to complete staging and rule out distant metastasis. -If CT chest is negative, he meets [place] criteria for liver transplant. He is followed by Roosevelt Locks, NP of [facility] for transplant work up. He quite drinking alcohol in 09/2019  and is trying to quit smoking completely. The patient is apprehensive about the transplant process.  -He is not a surgical resection candidate due to his underlying liver cirrhosis and portal hypertension.  -He has evidence of esophageal and gastric varices, followed by GI Dr. Silverio Decamp -He would be a good candidate for liver targeted therapy if staging scan shows no distant metastasis. Given the size of his tumor, he has been scheduled for thermal ablation by Dr. Laurence Ferrari.  -If patient does not proceed with transplant, he would still be monitored after liver targeted therapy in the future -We briefly discussed if he has recurrence or metastasis, we would then consider systemic treatment options with oral therapy vs immunotherapy, biological therapy. We discussed [place] is not very sensitive to chemotherapy.  -He has baseline thrombocytopenia due to his underlying liver cirrhosis, PLT 61 in 2/21 which we will monitor closely if he were to need systemic therapy in the future.  -We anticipate he will continue f/u with [facility] and IR, with restaging scan around 05/2020.  -We will see him back in 6 months, or sooner if needed.   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 Silverio Decamp. On nadolol for varices.  -Followed by Roosevelt Locks, NP of [facility] who will follow him for transplant work up and ongoing care  3. Hepatitis C -s/p 12 week treatment with Epclusa  4. Hereditary hemochromatosis  -mutation HFE gene, single copy H63D gene mutation  -his daughter is aware and plans to get tested -ferritin 821 prior to hep C treatment, improved to 177 after per [facility] note's  -he never had phlebotomy. No mention of iron deposits on MRI.  -He understands to avoid iron supplementation   PLAN: -Work up reviewed -CT Chest to complete staging, 3/29 -If CT chest negative, proceed with microwave ablation per Dr.  Laurence Ferrari 4/21 -Anticipate restaging 3 months after targeted therapy  -Lab, f/u [place] in 6 months  -Continue f/u with  GI ([place]), IR, and Roosevelt Locks, NP of liver care -Transplant work up after ablation -Avoid iron supplementation    Orders Placed This Encounter  Procedures  . CBC with Differential (Cancer Center Only)    Standing Status:   Standing    Number of Occurrences:   50    Standing Expiration Date:   02/05/2021  . CMP ([place] only)    Standing Status:   Standing    Number of Occurrences:   50    Standing Expiration Date:   02/05/2021  . AFP tumor marker    Standing Status:   Standing    Number of Occurrences:   50    Standing Expiration Date:   02/05/2021    All questions were answered. The patient knows to call the clinic with any problems, questions or concerns.     Alla Feeling, NP 02/06/2020   Addendum  I have seen the patient, examined him. I agree with the assessment and and plan and have edited the notes.   I have reviewed his lab and abdominal MRI independently, and agree with radiology interpretation.  I discussed the diagnosis and staging of his [place] with pt and his daughter, and treatment options.  His case has been discussed in the GI tumor board, Dr. Barry Dienes did not think he is a good candidate for surgical resection.  He has been evaluated for liver transplant.  He will proceed with liver targeted therapy with ablation by IR in the near future.  I discussed the risk of recurrence in the future, the need for close follow-up, and the indication for systemic therapy, which he does not need for now. I plan to follow up in 6 months, then yearly.  He voiced good understanding and agrees with the plan.  All questions were answered.  Truitt Merle  02/06/2020

## 2020-02-06 ENCOUNTER — Encounter: Payer: Self-pay | Admitting: Nurse Practitioner

## 2020-02-06 ENCOUNTER — Other Ambulatory Visit: Payer: Self-pay

## 2020-02-06 ENCOUNTER — Inpatient Hospital Stay (HOSPITAL_BASED_OUTPATIENT_CLINIC_OR_DEPARTMENT_OTHER): Payer: Medicare HMO | Admitting: Nurse Practitioner

## 2020-02-06 VITALS — BP 144/65 | HR 51 | Temp 98.2°F | Ht 65.0 in | Wt 166.3 lb

## 2020-02-06 DIAGNOSIS — Z833 Family history of diabetes mellitus: Secondary | ICD-10-CM | POA: Diagnosis not present

## 2020-02-06 DIAGNOSIS — C228 Malignant neoplasm of liver, primary, unspecified as to type: Secondary | ICD-10-CM

## 2020-02-06 DIAGNOSIS — F17210 Nicotine dependence, cigarettes, uncomplicated: Secondary | ICD-10-CM

## 2020-02-06 DIAGNOSIS — I10 Essential (primary) hypertension: Secondary | ICD-10-CM

## 2020-02-06 DIAGNOSIS — C220 Liver cell carcinoma: Secondary | ICD-10-CM | POA: Diagnosis present

## 2020-02-06 DIAGNOSIS — K635 Polyp of colon: Secondary | ICD-10-CM

## 2020-02-06 DIAGNOSIS — Z79899 Other long term (current) drug therapy: Secondary | ICD-10-CM

## 2020-02-06 DIAGNOSIS — B182 Chronic viral hepatitis C: Secondary | ICD-10-CM | POA: Diagnosis not present

## 2020-02-06 DIAGNOSIS — I8500 Esophageal varices without bleeding: Secondary | ICD-10-CM

## 2020-02-06 DIAGNOSIS — I864 Gastric varices: Secondary | ICD-10-CM | POA: Diagnosis not present

## 2020-02-06 DIAGNOSIS — Z8249 Family history of ischemic heart disease and other diseases of the circulatory system: Secondary | ICD-10-CM

## 2020-02-06 DIAGNOSIS — R161 Splenomegaly, not elsewhere classified: Secondary | ICD-10-CM | POA: Diagnosis not present

## 2020-02-06 NOTE — Progress Notes (Signed)
Met with patient and his daughter Sharyn Lull at initial Medical Oncology appointment with Cira Rue NP and Dr. Burr Medico, patient's daughter was given my card with my direct number as a resource for any questions or concerns that arise and my role as GI nurse navigator was explained.  They both verbalized an understanding and their questions were answered.

## 2020-02-07 ENCOUNTER — Telehealth: Payer: Self-pay | Admitting: Nurse Practitioner

## 2020-02-07 NOTE — Telephone Encounter (Signed)
Scheduled appt per 3/25 los.  Sent a message to HIM pool to get a calendar mailed out. 

## 2020-02-10 ENCOUNTER — Other Ambulatory Visit: Payer: Self-pay

## 2020-02-10 ENCOUNTER — Ambulatory Visit
Admission: RE | Admit: 2020-02-10 | Discharge: 2020-02-10 | Disposition: A | Discharge status: Home / Self Care | Payer: Medicare HMO | Source: Ambulatory Visit | Attending: Nurse Practitioner | Admitting: Nurse Practitioner

## 2020-02-10 DIAGNOSIS — C7800 Secondary malignant neoplasm of unspecified lung: Secondary | ICD-10-CM

## 2020-02-10 MED ORDER — IOPAMIDOL (ISOVUE-300) INJECTION 61%
75.0000 mL | Freq: Once | INTRAVENOUS | Status: AC | PRN
  Administered 2020-02-10: 12:00:00 75 mL via INTRAVENOUS

## 2020-02-25 ENCOUNTER — Other Ambulatory Visit: Payer: Self-pay | Admitting: Physician Assistant

## 2020-02-26 NOTE — Patient Instructions (Addendum)
DUE TO COVID-19 ONLY ONE VISITOR IS ALLOWED TO COME WITH YOU AND STAY IN THE WAITING ROOM ONLY DURING PRE OP AND PROCEDURE DAY OF SURGERY. THE 1 VISITOR MAY VISIT WITH YOU AFTER SURGERY IN YOUR PRIVATE ROOM DURING VISITING HOURS ONLY!  YOU NEED TO HAVE A COVID 19 TEST ON: 02/29/20 @ 12:20 pm     , THIS TEST MUST BE DONE BEFORE SURGERY, COME  [address], [place] , [id number].  ([place]) ONCE YOUR COVID TEST IS COMPLETED, PLEASE BEGIN THE QUARANTINE INSTRUCTIONS AS OUTLINED IN YOUR HANDOUT.                [place]    Your procedure is scheduled on: 03/04/20   Report to [facility] Main  Entrance   Report to admitting at: 10:00 AM     Call this number if you have problems the morning of surgery [phone]    Remember: Do not  eat solid food :After Midnight. Clear liquid diet from midnight until 9:00 am     CLEAR LIQUID DIET   Foods Allowed                                                                     Foods Excluded  Coffee and tea, regular and decaf                             liquids that you cannot  Plain Jell-O any favor except red or purple                                           see through such as: Fruit ices (not with fruit pulp)                                     milk, soups, orange juice  Iced Popsicles                                    All solid food Carbonated beverages, regular and diet                                    Cranberry, grape and apple juices Sports drinks like Gatorade Lightly seasoned clear broth or consume(fat free) Sugar, honey syrup  Sample Menu Breakfast                                Lunch                                     Supper Cranberry juice                    Beef broth  Chicken broth Jell-O                                     Grape juice                           Apple juice Coffee or tea                        Jell-O                                      Popsicle                                                 Coffee or tea                        Coffee or tea  _____________________________________________________________________  BRUSH YOUR TEETH MORNING OF SURGERY AND RINSE YOUR MOUTH OUT, NO CHEWING GUM CANDY OR MINTS.    Take these medicines the morning of surgery with A SIP OF WATER: levothyroxine,nadolol.               You may not have any metal on your body including hair pins and              piercings  Do not wear jewelry,lotions, powders or perfumes, deodorant             Men may shave face and neck.   Do not bring valuables to the hospital. [place].  Contacts, dentures or bridgework may not be worn into surgery.  Leave suitcase in the car. After surgery it may be brought to your room.     Patients discharged the day of surgery will not be allowed to drive home. IF YOU ARE HAVING SURGERY AND GOING HOME THE SAME DAY, YOU MUST HAVE AN ADULT TO DRIVE YOU HOME AND BE WITH YOU FOR 24 HOURS. YOU MAY GO HOME BY TAXI OR UBER OR ORTHERWISE, BUT AN ADULT MUST ACCOMPANY YOU HOME AND STAY WITH YOU FOR 24 HOURS.  Name and phone number of your driver:  Special Instructions: N/A              Please read over the following fact sheets you were given: _____________________________________________________________________             [facility] - Preparing for Surgery Before surgery, you can play an important role.  Because skin is not sterile, your skin needs to be as free of germs as possible.  You can reduce the number of germs on your skin by washing with CHG (chlorahexidine gluconate) soap before surgery.  CHG is an antiseptic cleaner which kills germs and bonds with the skin to continue killing germs even after washing. Please DO NOT use if you have an allergy to CHG or antibacterial soaps.  If your skin becomes reddened/irritated stop using the CHG and inform your nurse when you arrive at Short  Stay. Do not shave (including legs and  underarms) for at least 48 hours prior to the first CHG shower.  You may shave your face/neck. Please follow these instructions carefully:  1.  Shower with CHG Soap the night before surgery and the  morning of Surgery.  2.  If you choose to wash your hair, wash your hair first as usual with your  normal  shampoo.  3.  After you shampoo, rinse your hair and body thoroughly to remove the  shampoo.                           4.  Use CHG as you would any other liquid soap.  You can apply chg directly  to the skin and wash                       Gently with a scrungie or clean washcloth.  5.  Apply the CHG Soap to your body ONLY FROM THE NECK DOWN.   Do not use on face/ open                           Wound or open sores. Avoid contact with eyes, ears mouth and genitals (private parts).                       Wash face,  Genitals (private parts) with your normal soap.             6.  Wash thoroughly, paying special attention to the area where your surgery  will be performed.  7.  Thoroughly rinse your body with warm water from the neck down.  8.  DO NOT shower/wash with your normal soap after using and rinsing off  the CHG Soap.                9.  Pat yourself dry with a clean towel.            10.  Wear clean pajamas.            11.  Place clean sheets on your bed the night of your first shower and do not  sleep with pets. Day of Surgery : Do not apply any lotions/deodorants the morning of surgery.  Please wear clean clothes to the hospital/surgery center.  FAILURE TO FOLLOW THESE INSTRUCTIONS MAY RESULT IN THE CANCELLATION OF YOUR SURGERY PATIENT SIGNATURE_________________________________  NURSE SIGNATURE__________________________________  ________________________________________________________________________

## 2020-02-27 ENCOUNTER — Other Ambulatory Visit: Payer: Self-pay

## 2020-02-27 ENCOUNTER — Encounter (HOSPITAL_COMMUNITY): Payer: Self-pay

## 2020-02-27 ENCOUNTER — Encounter (HOSPITAL_COMMUNITY)
Admission: RE | Admit: 2020-02-27 | Discharge: 2020-02-27 | Disposition: A | Discharge status: Home / Self Care | Payer: Medicare HMO | Source: Ambulatory Visit | Attending: Interventional Radiology | Admitting: Interventional Radiology

## 2020-02-27 DIAGNOSIS — Z01818 Encounter for other preprocedural examination: Secondary | ICD-10-CM | POA: Diagnosis not present

## 2020-02-27 DIAGNOSIS — R001 Bradycardia, unspecified: Secondary | ICD-10-CM | POA: Diagnosis not present

## 2020-02-27 HISTORY — DX: Inflammatory liver disease, unspecified: K75.9

## 2020-02-27 LAB — CBC WITH DIFFERENTIAL/PLATELET
Abs Immature Granulocytes: 0.01 10*3/uL (ref 0.00–0.07)
Basophils Absolute: 0 10*3/uL (ref 0.0–0.1)
Basophils Relative: 0 %
Eosinophils Absolute: 0.2 10*3/uL (ref 0.0–0.5)
Eosinophils Relative: 3 %
HCT: 33.2 % — ABNORMAL LOW (ref 39.0–52.0)
Hemoglobin: 11.6 g/dL — ABNORMAL LOW (ref 13.0–17.0)
Immature Granulocytes: 0 %
Lymphocytes Relative: 42 %
Lymphs Abs: 2.4 10*3/uL (ref 0.7–4.0)
MCH: 32.5 pg (ref 26.0–34.0)
MCHC: 34.9 g/dL (ref 30.0–36.0)
MCV: 93 fL (ref 80.0–100.0)
Monocytes Absolute: 0.6 10*3/uL (ref 0.1–1.0)
Monocytes Relative: 11 %
Neutro Abs: 2.4 10*3/uL (ref 1.7–7.7)
Neutrophils Relative %: 44 %
Platelets: 64 10*3/uL — ABNORMAL LOW (ref 150–400)
RBC: 3.57 MIL/uL — ABNORMAL LOW (ref 4.22–5.81)
RDW: 12.6 % (ref 11.5–15.5)
WBC: 5.6 10*3/uL (ref 4.0–10.5)
nRBC: 0 % (ref 0.0–0.2)

## 2020-02-27 NOTE — Progress Notes (Signed)
PCP - Orlinda Blalock. LOV: 02/06/20 Cardiologist -   Chest x-ray - 02/10/20 EKG -  Stress Test -  ECHO -  Cardiac Cath -   Sleep Study -  CPAP -   Fasting Blood Sugar -  Checks Blood Sugar _____ times a day  Blood Thinner Instructions: Aspirin Instructions: Last Dose:  Anesthesia review:   Patient denies shortness of breath, fever, cough and chest pain at PAT appointment   Patient verbalized understanding of instructions that were given to them at the PAT appointment. Patient was also instructed that they will need to review over the PAT instructions again at home before surgery.

## 2020-02-28 NOTE — Progress Notes (Signed)
Platelets results: 64.

## 2020-02-29 ENCOUNTER — Inpatient Hospital Stay (HOSPITAL_COMMUNITY): Admission: RE | Admit: 2020-02-29 | Payer: Medicare HMO | Source: Ambulatory Visit

## 2020-03-03 ENCOUNTER — Other Ambulatory Visit: Payer: Self-pay | Admitting: Radiology

## 2020-03-04 ENCOUNTER — Encounter (HOSPITAL_COMMUNITY): Payer: Self-pay | Admitting: Interventional Radiology

## 2020-03-04 ENCOUNTER — Observation Stay (HOSPITAL_COMMUNITY)
Admission: RE | Admit: 2020-03-04 | Discharge: 2020-03-05 | Disposition: A | Discharge status: Home / Self Care | Payer: Medicare HMO | Source: Ambulatory Visit | Attending: Interventional Radiology | Admitting: Interventional Radiology

## 2020-03-04 ENCOUNTER — Other Ambulatory Visit: Payer: Self-pay

## 2020-03-04 ENCOUNTER — Ambulatory Visit (HOSPITAL_COMMUNITY): Payer: Medicare HMO

## 2020-03-04 ENCOUNTER — Encounter (HOSPITAL_COMMUNITY): Payer: Self-pay

## 2020-03-04 ENCOUNTER — Ambulatory Visit (HOSPITAL_COMMUNITY): Payer: Medicare HMO | Admitting: Certified Registered Nurse Anesthetist

## 2020-03-04 ENCOUNTER — Ambulatory Visit (HOSPITAL_COMMUNITY)
Admission: RE | Admit: 2020-03-04 | Discharge: 2020-03-04 | Disposition: A | Discharge status: Home / Self Care | Payer: Medicare HMO | Source: Ambulatory Visit | Attending: Interventional Radiology | Admitting: Interventional Radiology

## 2020-03-04 ENCOUNTER — Encounter (HOSPITAL_COMMUNITY)
Admission: RE | Disposition: A | Discharge status: Home / Self Care | Payer: Self-pay | Source: Ambulatory Visit | Attending: Interventional Radiology

## 2020-03-04 DIAGNOSIS — Z79899 Other long term (current) drug therapy: Secondary | ICD-10-CM | POA: Insufficient documentation

## 2020-03-04 DIAGNOSIS — K7030 Alcoholic cirrhosis of liver without ascites: Secondary | ICD-10-CM | POA: Diagnosis not present

## 2020-03-04 DIAGNOSIS — Z20822 Contact with and (suspected) exposure to covid-19: Secondary | ICD-10-CM | POA: Insufficient documentation

## 2020-03-04 DIAGNOSIS — Z01818 Encounter for other preprocedural examination: Secondary | ICD-10-CM

## 2020-03-04 DIAGNOSIS — C220 Liver cell carcinoma: Principal | ICD-10-CM | POA: Insufficient documentation

## 2020-03-04 DIAGNOSIS — I700 Atherosclerosis of aorta: Secondary | ICD-10-CM | POA: Insufficient documentation

## 2020-03-04 DIAGNOSIS — B1920 Unspecified viral hepatitis C without hepatic coma: Secondary | ICD-10-CM | POA: Insufficient documentation

## 2020-03-04 DIAGNOSIS — K8020 Calculus of gallbladder without cholecystitis without obstruction: Secondary | ICD-10-CM | POA: Insufficient documentation

## 2020-03-04 DIAGNOSIS — I10 Essential (primary) hypertension: Secondary | ICD-10-CM | POA: Diagnosis not present

## 2020-03-04 DIAGNOSIS — Z79890 Hormone replacement therapy (postmenopausal): Secondary | ICD-10-CM | POA: Diagnosis not present

## 2020-03-04 DIAGNOSIS — F17210 Nicotine dependence, cigarettes, uncomplicated: Secondary | ICD-10-CM | POA: Insufficient documentation

## 2020-03-04 HISTORY — PX: RADIOLOGY WITH ANESTHESIA: SHX6223

## 2020-03-04 LAB — CBC WITH DIFFERENTIAL/PLATELET
Abs Immature Granulocytes: 0.02 10*3/uL (ref 0.00–0.07)
Basophils Absolute: 0 10*3/uL (ref 0.0–0.1)
Basophils Relative: 0 %
Eosinophils Absolute: 0.2 10*3/uL (ref 0.0–0.5)
Eosinophils Relative: 3 %
HCT: 33.5 % — ABNORMAL LOW (ref 39.0–52.0)
Hemoglobin: 11.7 g/dL — ABNORMAL LOW (ref 13.0–17.0)
Immature Granulocytes: 0 %
Lymphocytes Relative: 43 %
Lymphs Abs: 2.1 10*3/uL (ref 0.7–4.0)
MCH: 32.4 pg (ref 26.0–34.0)
MCHC: 34.9 g/dL (ref 30.0–36.0)
MCV: 92.8 fL (ref 80.0–100.0)
Monocytes Absolute: 0.6 10*3/uL (ref 0.1–1.0)
Monocytes Relative: 12 %
Neutro Abs: 2.1 10*3/uL (ref 1.7–7.7)
Neutrophils Relative %: 42 %
Platelets: 71 10*3/uL — ABNORMAL LOW (ref 150–400)
RBC: 3.61 MIL/uL — ABNORMAL LOW (ref 4.22–5.81)
RDW: 12.5 % (ref 11.5–15.5)
WBC: 5 10*3/uL (ref 4.0–10.5)
nRBC: 0 % (ref 0.0–0.2)

## 2020-03-04 LAB — COMPREHENSIVE METABOLIC PANEL
ALT: 21 U/L (ref 0–44)
AST: 35 U/L (ref 15–41)
Albumin: 3.4 g/dL — ABNORMAL LOW (ref 3.5–5.0)
Alkaline Phosphatase: 81 U/L (ref 38–126)
Anion gap: 8 (ref 5–15)
BUN: 26 mg/dL — ABNORMAL HIGH (ref 8–23)
CO2: 23 mmol/L (ref 22–32)
Calcium: 9.7 mg/dL (ref 8.9–10.3)
Chloride: 107 mmol/L (ref 98–111)
Creatinine, Ser: 0.87 mg/dL (ref 0.61–1.24)
GFR calc Af Amer: >60 mL/min (ref >60)
GFR calc non Af Amer: >60 mL/min (ref >60)
Glucose, Bld: 106 mg/dL — ABNORMAL HIGH (ref 70–99)
Potassium: 4.6 mmol/L (ref 3.5–5.1)
Sodium: 138 mmol/L (ref 135–145)
Total Bilirubin: 0.6 mg/dL (ref 0.3–1.2)
Total Protein: 8 g/dL (ref 6.5–8.1)

## 2020-03-04 LAB — TYPE AND SCREEN
ABO/RH(D): O POS
Antibody Screen: NEGATIVE

## 2020-03-04 LAB — PROTIME-INR
INR: 1.1 (ref 0.8–1.2)
Prothrombin Time: 13.6 seconds (ref 11.4–15.2)

## 2020-03-04 LAB — RESPIRATORY PANEL BY RT PCR (FLU A&B, COVID)
Influenza A by PCR: NEGATIVE
Influenza B by PCR: NEGATIVE
SARS Coronavirus 2 by RT PCR: NEGATIVE

## 2020-03-04 SURGERY — CT WITH ANESTHESIA
Anesthesia: General

## 2020-03-04 MED ORDER — ROCURONIUM BROMIDE 50 MG/5ML IV SOSY
PREFILLED_SYRINGE | INTRAVENOUS | Status: DC | PRN
  Administered 2020-03-04: 100 mg via INTRAVENOUS
  Administered 2020-03-04: 20 mg via INTRAVENOUS

## 2020-03-04 MED ORDER — PIPERACILLIN-TAZOBACTAM 3.375 G IVPB
3.3750 g | Freq: Once | INTRAVENOUS | Status: AC
  Administered 2020-03-04: 3.375 g via INTRAVENOUS
  Filled 2020-03-04: qty 50

## 2020-03-04 MED ORDER — MIDAZOLAM HCL 2 MG/2ML IJ SOLN
INTRAMUSCULAR | Status: AC
  Filled 2020-03-04: qty 2

## 2020-03-04 MED ORDER — PROPOFOL 10 MG/ML IV BOLUS
INTRAVENOUS | Status: DC | PRN
  Administered 2020-03-04: 130 mg via INTRAVENOUS

## 2020-03-04 MED ORDER — HYDROCODONE-ACETAMINOPHEN 5-325 MG PO TABS
1.0000 | ORAL_TABLET | ORAL | Status: DC | PRN
  Administered 2020-03-04: 1 via ORAL
  Filled 2020-03-04: qty 1

## 2020-03-04 MED ORDER — EPHEDRINE SULFATE-NACL 50-0.9 MG/10ML-% IV SOSY
PREFILLED_SYRINGE | INTRAVENOUS | Status: DC | PRN
  Administered 2020-03-04 (×2): 10 mg via INTRAVENOUS
  Administered 2020-03-04 (×2): 15 mg via INTRAVENOUS

## 2020-03-04 MED ORDER — LIDOCAINE 2% (20 MG/ML) 5 ML SYRINGE
INTRAMUSCULAR | Status: DC | PRN
  Administered 2020-03-04: 60 mg via INTRAVENOUS

## 2020-03-04 MED ORDER — IOHEXOL 350 MG/ML SOLN
50.0000 mL | Freq: Once | INTRAVENOUS | Status: AC | PRN
  Administered 2020-03-04: 50 mL via INTRAVENOUS

## 2020-03-04 MED ORDER — DEXAMETHASONE SODIUM PHOSPHATE 10 MG/ML IJ SOLN
INTRAMUSCULAR | Status: DC | PRN
  Administered 2020-03-04: 7 mg via INTRAVENOUS

## 2020-03-04 MED ORDER — ONDANSETRON HCL 4 MG/2ML IJ SOLN: 4.0000 mg | Freq: Four times a day (QID) | INTRAMUSCULAR | Status: DC | PRN

## 2020-03-04 MED ORDER — LEVOTHYROXINE SODIUM 50 MCG PO TABS
50.0000 ug | ORAL_TABLET | Freq: Every day | ORAL | Status: DC
  Administered 2020-03-05: 50 ug via ORAL
  Filled 2020-03-04: qty 1

## 2020-03-04 MED ORDER — MIDAZOLAM HCL 5 MG/5ML IJ SOLN
INTRAMUSCULAR | Status: DC | PRN
  Administered 2020-03-04: 1 mg via INTRAVENOUS

## 2020-03-04 MED ORDER — LISINOPRIL 10 MG PO TABS
10.0000 mg | ORAL_TABLET | Freq: Every day | ORAL | Status: DC
  Administered 2020-03-05: 10 mg via ORAL
  Filled 2020-03-04: qty 1

## 2020-03-04 MED ORDER — SODIUM CHLORIDE (PF) 0.9 % IJ SOLN
INTRAMUSCULAR | Status: AC
  Filled 2020-03-04: qty 50

## 2020-03-04 MED ORDER — LACTATED RINGERS IV SOLN: INTRAVENOUS | Status: DC

## 2020-03-04 MED ORDER — FENTANYL CITRATE (PF) 250 MCG/5ML IJ SOLN
INTRAMUSCULAR | Status: DC | PRN
  Administered 2020-03-04 (×3): 50 ug via INTRAVENOUS

## 2020-03-04 MED ORDER — FENTANYL CITRATE (PF) 100 MCG/2ML IJ SOLN: 25.0000 ug | INTRAMUSCULAR | Status: DC | PRN

## 2020-03-04 MED ORDER — IOHEXOL 350 MG/ML SOLN: 75.0000 mL | Freq: Once | INTRAVENOUS | Status: DC | PRN

## 2020-03-04 MED ORDER — SUGAMMADEX SODIUM 200 MG/2ML IV SOLN
INTRAVENOUS | Status: DC | PRN
  Administered 2020-03-04: 200 mg via INTRAVENOUS

## 2020-03-04 MED ORDER — LABETALOL HCL 5 MG/ML IV SOLN
INTRAVENOUS | Status: DC | PRN
  Administered 2020-03-04: 1 mg via INTRAVENOUS

## 2020-03-04 MED ORDER — PHENYLEPHRINE HCL-NACL 10-0.9 MG/250ML-% IV SOLN
INTRAVENOUS | Status: DC | PRN
  Administered 2020-03-04: 50 ug/min via INTRAVENOUS

## 2020-03-04 MED ORDER — ONDANSETRON HCL 4 MG/2ML IJ SOLN
INTRAMUSCULAR | Status: DC | PRN
  Administered 2020-03-04: 4 mg via INTRAVENOUS

## 2020-03-04 MED ORDER — SENNOSIDES-DOCUSATE SODIUM 8.6-50 MG PO TABS: 1.0000 | ORAL_TABLET | Freq: Every evening | ORAL | Status: DC | PRN

## 2020-03-04 MED ORDER — FENTANYL CITRATE (PF) 250 MCG/5ML IJ SOLN
INTRAMUSCULAR | Status: AC
  Filled 2020-03-04: qty 5

## 2020-03-04 MED ORDER — DOCUSATE SODIUM 100 MG PO CAPS
100.0000 mg | ORAL_CAPSULE | Freq: Two times a day (BID) | ORAL | Status: DC
  Administered 2020-03-05: 10:00:00 100 mg via ORAL
  Filled 2020-03-04 (×2): qty 1

## 2020-03-04 MED ORDER — PHENYLEPHRINE 40 MCG/ML (10ML) SYRINGE FOR IV PUSH (FOR BLOOD PRESSURE SUPPORT)
PREFILLED_SYRINGE | INTRAVENOUS | Status: DC | PRN
  Administered 2020-03-04 (×2): 120 ug via INTRAVENOUS
  Administered 2020-03-04: 80 ug via INTRAVENOUS

## 2020-03-04 MED ORDER — ALBUTEROL SULFATE HFA 108 (90 BASE) MCG/ACT IN AERS
INHALATION_SPRAY | RESPIRATORY_TRACT | Status: DC | PRN
  Administered 2020-03-04 (×2): 2 via RESPIRATORY_TRACT

## 2020-03-04 MED ORDER — NADOLOL 20 MG PO TABS
20.0000 mg | ORAL_TABLET | Freq: Every day | ORAL | Status: DC
  Administered 2020-03-05: 10:00:00 20 mg via ORAL
  Filled 2020-03-04: qty 1

## 2020-03-04 NOTE — Anesthesia Preprocedure Evaluation (Addendum)
Anesthesia Evaluation  Patient identified by MRN, date of birth, ID band Patient awake    Reviewed: Allergy & Precautions, NPO status , Patient's Chart, lab work & pertinent test results  Airway Mallampati: II  TM Distance: >3 FB Neck ROM: Full    Dental  (+) Edentulous Upper, Edentulous Lower   Pulmonary neg pulmonary ROS, Current SmokerPatient did not abstain from smoking.,    Pulmonary exam normal breath sounds clear to auscultation       Cardiovascular hypertension, Pt. on medications negative cardio ROS Normal cardiovascular exam Rhythm:Regular Rate:Normal     Neuro/Psych negative neurological ROS  negative psych ROS   GI/Hepatic negative GI ROS, (+) Hepatitis -, C  Endo/Other  negative endocrine ROS  Renal/GU negative Renal ROS  negative genitourinary   Musculoskeletal negative musculoskeletal ROS (+)   Abdominal   Peds  Hematology negative hematology ROS (+)   Anesthesia Other Findings HCC  Reproductive/Obstetrics                           Anesthesia Physical Anesthesia Plan  ASA: II  Anesthesia Plan: General   Post-op Pain Management:    Induction: Intravenous  PONV Risk Score and Plan: 1 and Midazolam, Dexamethasone and Ondansetron  Airway Management Planned: Oral ETT  Additional Equipment:   Intra-op Plan:   Post-operative Plan: Extubation in OR  Informed Consent: I have reviewed the patients History and Physical, chart, labs and discussed the procedure including the risks, benefits and alternatives for the proposed anesthesia with the patient or authorized representative who has indicated his/her understanding and acceptance.     Dental advisory given  Plan Discussed with: CRNA  Anesthesia Plan Comments:        Anesthesia Quick Evaluation

## 2020-03-04 NOTE — Transfer of Care (Signed)
Immediate Anesthesia Transfer of Care Note  Patient: Fernando Ryan  Procedure(s) Performed: CT WITH ANESTHESIA  MICROWAVE ABLATION (N/A )  Patient Location: PACU  Anesthesia Type:General  Level of Consciousness: awake, oriented and patient cooperative  Airway & Oxygen Therapy: Patient Spontanous Breathing and Patient connected to face mask oxygen  Post-op Assessment: Report given to RN and Post -op Vital signs reviewed and stable  Post vital signs: Reviewed and stable  Last Vitals:  Vitals Value Taken Time  BP    Temp    Pulse    Resp    SpO2      Last Pain:  Vitals:   03/04/20 0943  PainSc: 0-No pain         Complications: No apparent anesthesia complications

## 2020-03-04 NOTE — Procedures (Signed)
Interventional Radiology Procedure Note  Procedure: Percutaneous thermal ablation of liver lesion, HCC.   Complications: None  Estimated Blood Loss: None  Recommendations: - Admit for observation   Signed,  Criselda Peaches, MD

## 2020-03-04 NOTE — Progress Notes (Signed)
Received report at 1645.

## 2020-03-04 NOTE — H&P (Deleted)
  The note originally documented on this encounter has been moved the the encounter in which it belongs.  

## 2020-03-04 NOTE — Anesthesia Procedure Notes (Addendum)
Procedure Name: Intubation Date/Time: 03/04/2020 12:30 PM Performed by: West Pugh, CRNA Pre-anesthesia Checklist: Patient identified, Emergency Drugs available, Suction available, Patient being monitored and Timeout performed Patient Re-evaluated:Patient Re-evaluated prior to induction Oxygen Delivery Method: Circle system utilized Preoxygenation: Pre-oxygenation with 100% oxygen Induction Type: IV induction Ventilation: Mask ventilation without difficulty Laryngoscope Size: Mac and 4 Grade View: Grade I Tube type: Oral Number of attempts: 1 Airway Equipment and Method: Stylet Placement Confirmation: ETT inserted through vocal cords under direct vision,  positive ETCO2,  CO2 detector and breath sounds checked- equal and bilateral Secured at: 21 cm Tube secured with: Tape Dental Injury: Teeth and Oropharynx as per pre-operative assessment

## 2020-03-04 NOTE — H&P (Signed)
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  Lesion is consistent with a Li-RADS category 5, diagnostic for hepatocellular carcinoma.  Patient consulted with Dr. Jacqulynn Cadet via telephone on 01/28/2020 to discuss treatment options. Patient was deemed an appropriate candidate for microwave ablation of the liver lesion and presents today for this procedure.   Patient currently denies any pain or discomfort, denies bleeding, nausea, chest/abdominal/back pain.   Past Medical History:  Diagnosis Date  . Hepatitis    Hep C  . Hypertension    Past Surgical History:  Procedure Laterality Date  . COLONOSCOPY WITH PROPOFOL N/A 01/07/2020   Procedure: COLONOSCOPY WITH PROPOFOL;  Surgeon: Mauri Pole, MD;  Location: [facility];  Service: Endoscopy;  Laterality: N/A;  . ESOPHAGOGASTRODUODENOSCOPY (EGD) WITH PROPOFOL N/A 01/07/2020   Procedure: ESOPHAGOGASTRODUODENOSCOPY (EGD) WITH PROPOFOL;  Surgeon: Mauri Pole, MD;  Location: [facility];  Service: Endoscopy;  Laterality: N/A;  . FEMUR FRACTURE SURGERY    . HEMOSTASIS CLIP PLACEMENT  01/07/2020   Procedure: HEMOSTASIS CLIP PLACEMENT;  Surgeon: Mauri Pole, MD;  Location: [facility];  Service: Endoscopy;;  . IR RADIOLOGIST EVAL & MGMT  01/28/2020  . POLYPECTOMY  01/07/2020   Procedure: POLYPECTOMY;  Surgeon: Mauri Pole, MD;  Location: [facility];  Service: Endoscopy;;   Allergies: Patient has no known allergies.  Medications: Prior to Admission medications   Medication Sig Start Date End Date Taking? Authorizing Provider  Cyanocobalamin (VITAMIN B 12) 500 MCG TABS Take 500 mg by mouth 3 (three) times  a week.     [provider]  levothyroxine (SYNTHROID) 50 MCG tablet Take 50 mcg by mouth daily.  12/23/19   [provider]  lisinopril (ZESTRIL) 10 MG tablet Take 10 mg by mouth daily. 12/23/19   [provider]  nadolol (CORGARD) 20 MG tablet Take 1 tablet (20 mg total) by mouth daily. 01/07/20 01/06/21  Mauri Pole, MD     Vital Signs: BP 137/73   Pulse (!) 55   Temp 98.4 F (36.9 C) (Oral)   Resp 16   SpO2 100%   Physical Exam  HENT: Patient is edentulous, non-icteric Cardiac: regular rate and rhythm, S1, S2, no murmur. Extremities warm and dry, pulses strong and equal bilaterally. Respiratory: lungs are clear bilaterally, easy work of breathing Abdominal: soft, non-tender, non-distended, active bowel sounds.  Neuro: Alert and oriented x4, affect and mood appropriate for situation  Imaging: No results found.  Labs:  CBC: Recent Labs    12/31/19 1554 02/27/20 1509 03/04/20 1005  WBC 5.3 5.6 5.0  HGB 11.9* 11.6* 11.7*  HCT 34.0* 33.2* 33.5*  PLT 61.0* 64* 71*    COAGS: Recent Labs    12/31/19 1554 03/04/20 1005  INR 1.2* 1.1    BMP: Recent Labs    12/31/19 1554 03/04/20 1005  NA 137 138  K 4.6 4.6  CL 105 107  CO2 27 23  GLUCOSE 83 106*  BUN 23 26*  CALCIUM 10.0 9.7  CREATININE 0.91 0.87  GFRNONAA  --  >60  GFRAA  --  >60    LIVER FUNCTION TESTS: Recent Labs    12/31/19 1554 03/04/20 1005  BILITOT 0.5 0.6  AST 34  35  ALT 21 21  ALKPHOS 86 81  PROT 8.3 8.0  ALBUMIN 4.2 3.4*    Assessment and Plan:  69 year old gentleman with alcoholic and HCV cirrhosis complicated by a newly diagnosed 2.1 cm hepatocellular carcinoma in the posterior and medial aspect of hepatic segment 6. Review of his MRI demonstrates a solitary 2.1 x 1.7 cm lesion. Patient is a candidate for CT guided percutaneous thermal ablation and has a high likelihood of achieving local control.     Patient presents today for CT-Guided percutaneous  thermal ablation of solitary liver lesion. Risks and benefits of this procedure, including but not limited to internal bleeding, infection, injury to adjacent structures, anesthesia related complications discussed with patient, patient verbalized understanding of risks and benefits.Patient aware of the plan for intubation and overnight observation following procedure. Consent obtained.  This procedure involves the use of X-rays and because of the nature of the planned procedure, it is possible that we will have prolonged use of X-ray fluoroscopy.  Potential radiation risks to you include (but are not limited to) the following: - A slightly elevated risk for cancer several years later in life. This risk is typically less than 0.5% percent. This risk is low in comparison to the normal incidence of human cancer, which is 33% for women and 50% for men according to the [place]. - Radiation induced injury can include skin redness, resembling a rash, tissue breakdown / ulcers and hair loss (which can be temporary or permanent).   The likelihood of either of these occurring depends on the difficulty of the procedure and whether you are sensitive to radiation due to previous procedures, disease, or genetic conditions.   IF your procedure requires a prolonged use of radiation, you will be notified and given written instructions for further action.  It is your responsibility to monitor the irradiated area for the 2 weeks following the procedure and to notify your physician if you are concerned that you have suffered a radiation induced injury.     Electronically Signed: Theresa Duty, NP 03/04/2020, 11:11 AM   I spent a total of 30 min. at the the patient's bedside AND on the patient's hospital floor or unit, greater than 50% of which was counseling/coordinating care for percutaneous thermal ablation of liver lesion

## 2020-03-04 NOTE — Sedation Documentation (Addendum)
Anesthesia in to sedate and monitor. 

## 2020-03-05 ENCOUNTER — Encounter: Payer: Self-pay | Admitting: *Deleted

## 2020-03-05 DIAGNOSIS — C220 Liver cell carcinoma: Secondary | ICD-10-CM | POA: Diagnosis not present

## 2020-03-05 NOTE — Progress Notes (Signed)
Patient discharged to home with family, discharge instructions reviewed with patient who verbalized understanding. 

## 2020-03-05 NOTE — Anesthesia Postprocedure Evaluation (Signed)
Anesthesia Post Note  Patient: Fernando Ryan  Procedure(s) Performed: CT WITH ANESTHESIA  MICROWAVE ABLATION (N/A )     Patient location during evaluation: PACU Anesthesia Type: General Level of consciousness: awake and alert Pain management: pain level controlled Vital Signs Assessment: post-procedure vital signs reviewed and stable Respiratory status: spontaneous breathing, nonlabored ventilation, respiratory function stable and patient connected to nasal cannula oxygen Cardiovascular status: blood pressure returned to baseline and stable Postop Assessment: no apparent nausea or vomiting Anesthetic complications: no    Last Vitals:  Vitals:   03/05/20 0122 03/05/20 0530  BP: (!) 119/58 (!) 124/59  Pulse: 61 (!) 59  Resp: 18 20  Temp: 36.6 C (!) 36.4 C  SpO2: 97% 98%    Last Pain:  Vitals:   03/05/20 0745  TempSrc:   PainSc: 0-No pain                 Edmund Rick L Prakash Kimberling

## 2020-03-05 NOTE — Discharge Summary (Signed)
.   Patient ID: Fernando Ryan MRN: [id number] DOB/AGE: 1951/05/01 69 y.o.  Admit date: 03/04/2020 Discharge date: 03/05/2020  Supervising Physician: Arne Cleveland  Patient Status: [facility] - In-pt  Admission Diagnoses: Hepatocellular carcinoma  Discharge Diagnoses:  Active Problems:   Hepatocellular carcinoma [facility])   Discharged Condition: good  Hospital Course: Patient presented to [facility] on 03/04/2020 for a CT-guided percutaneous thermal ablation of a liver lesion. Procedure occurred without major complications, patient was transferred to the floor for overnight observation. No major events occurred overnight.   This morning, patient is awake, alert and denies pain or discomfort. He is eating and drinking and denies any nausea or vomiting. Right flank incision site is clean, dry and intact without bleeding or erythema. Plan is to discharge home today with follow up televisit with Dr. Laurence Ferrari in 4 weeks.    Significant Diagnostic Studies: DG Chest 1 View  Result Date: 03/04/2020 CLINICAL DATA:  69 year old male under preoperative evaluation prior to ablation for hepatocellular carcinoma. EXAM: CHEST  1 VIEW COMPARISON:  Chest x-ray 01/26/2009. FINDINGS: Lung volumes are normal. No consolidative airspace disease. No pleural effusions. No pneumothorax. No pulmonary nodule or mass noted. Pulmonary vasculature and the cardiomediastinal silhouette are within normal limits. IMPRESSION: No radiographic evidence of acute cardiopulmonary disease. Electronically Signed   By: Vinnie Langton M.D.   On: 03/04/2020 11:43   CT CHEST W CONTRAST  Result Date: 02/10/2020 CLINICAL DATA:  Hepatocellular carcinoma. Screen for pulmonary metastasis prior to RF ablation. EXAM: CT CHEST WITH CONTRAST TECHNIQUE: Multidetector CT imaging of the chest was performed during intravenous contrast administration. CONTRAST:  4mL ISOVUE-300 IOPAMIDOL (ISOVUE-300) INJECTION 61% COMPARISON:  MRI abdomen  12/28/2019 FINDINGS: Cardiovascular: Heart size is normal. Some coronary artery calcification is present. Aortic atherosclerosis without aneurysm or dissection. Pulmonary arteries appear normal. No pericardial fluid. Mediastinum/Nodes: No neoplastic mass. Few calcified nodes in the right hilum. Lungs/Pleura: No evidence of pulmonary metastatic disease. No underlying emphysema. No active pneumonia, collapse or effusion. Small calcified granulomas at the right lung base. Upper Abdomen: See results of recent liver MRI. Hepatocellular mass in the posterior right lobe as better shown by that technique. Small gallstones layering dependently in the gallbladder. Numerous upper abdominal varices. Musculoskeletal: Ordinary thoracic degenerative changes. IMPRESSION: No evidence of intrathoracic metastatic disease. Aortic atherosclerosis.  Coronary artery calcification. Calcified granulomas in the right lower lobe with small right-sided calcified nodes. Right lobe liver lesion as shown by previous MRI. Gallstones. Upper abdominal varices. Electronically Signed   By: Nelson Chimes M.D.   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 He is not a candidate for resection and presents for percutaneous thermal ablation. EXAM: CT-guided percutaneous thermal ablation, liver lesion COMPARISON:  MRI abdomen 12/28/2019 MEDICATIONS: 3.375 g Zosyn; The antibiotic was administered in an appropriate time interval prior to needle puncture of the skin. ANESTHESIA/SEDATION: General - as administered by the Anesthesia department FLUOROSCOPY TIME:  None. COMPLICATIONS: None immediate. TECHNIQUE: Informed written consent was obtained from the patient after a thorough discussion of the procedural risks, benefits and alternatives. All questions were addressed. Maximal  Sterile Barrier Technique was utilized including caps, mask, sterile gowns, sterile gloves, sterile drape, hand hygiene and skin antiseptic. A timeout was performed prior to the initiation of the procedure. A planning axial CT scan of the liver was performed with and without  intravenous contrast. The lesion was successfully identified. A suitable skin entry site was selected and marked. The overlying skin was sterilely prepped and draped in the standard fashion using chlorhexidine skin prep. A small dermatotomy was made. Under intermittent CT guidance, a single NeuWave PR XT probe was advanced through the liver and positioned in the central mass of the lesion. Percutaneous thermal ablation was then performed at 65 watts for 5 minutes. The patient's blood pressure was monitored throughout the procedure. He did have a significant increase in his blood pressure which was immediately treated by Anesthesiology with good result. Following the ablation, a repeat CT scan with intravenous contrast was administered. The ablation defect completely encompasses the lesion. No residual enhancing tissue identified. No evidence of immediate complication. The patient tolerated the procedure well. FINDINGS: Successful percutaneous thermal ablation of liver lesion. IMPRESSION: Successful percutaneous thermal ablation of hepatocellular carcinoma. Electronically Signed   By: Jacqulynn Cadet M.D.   On: 03/04/2020 17:22    Treatments: CT-guided thermal ablation of liver lesion  Discharge Exam: Blood pressure (!) 124/59, pulse (!) 59, temperature (!) 97.5 F (36.4 C), temperature source Oral, resp. rate 20, height 5\' 5"  (1.651 m), weight 164 lb (74.4 kg), SpO2 98 %. Physical Exam Vitals and nursing note reviewed.  Constitutional:      General: He is not in acute distress. Cardiovascular:     Rate and Rhythm: Normal rate and regular rhythm.  Pulmonary:     Effort: Pulmonary effort is normal. No respiratory distress.      Breath sounds: Normal breath sounds. No wheezing.  Abdominal:     Comments: Right flank incision is soft without tenderness, erythema, drainage or active bleeding  Skin:    General: Skin is warm and dry.  Neurological:     Mental Status: He is alert and oriented to person, place, and time.       Disposition: Discharge disposition: 01-Home or Self Care       Discharge Instructions    Call MD for:  difficulty breathing, headache or visual disturbances   Complete by: As directed    Call MD for:  extreme fatigue   Complete by: As directed    Call MD for:  hives   Complete by: As directed    Call MD for:  persistant dizziness or light-headedness   Complete by: As directed    Call MD for:  persistant nausea and vomiting   Complete by: As directed    Call MD for:  redness, tenderness, or signs of infection (pain, swelling, redness, odor or green/yellow discharge around incision site)   Complete by: As directed    Call MD for:  severe uncontrolled pain   Complete by: As directed    Call MD for:  temperature >100.4   Complete by: As directed    Diet - low sodium heart healthy   Complete by: As directed    Discharge instructions   Complete by: As directed    Take over the counter pain medicine as needed for pain. No showering for 24 hours after discharge. Leave bandaid on during first shower, remove the bandaid after the shower and pat the area dry. Do not bath, swim or otherwise submerge the area for 7 days after discharge. Do not lift more than 10 pounds for two weeks. Minimize bending/stooping for two weeks.   Increase activity slowly   Complete by: As directed      Allergies as of 03/05/2020   No Known Allergies  Medication List    TAKE these medications   levothyroxine 50 MCG tablet Commonly known as: SYNTHROID Take 50 mcg by mouth daily.   lisinopril 10 MG tablet Commonly known as: ZESTRIL Take 10 mg by mouth daily.   nadolol 20 MG tablet Commonly known as:  Corgard Take 1 tablet (20 mg total) by mouth daily.   Vitamin B 12 500 MCG Tabs Take 500 mg by mouth 3 (three) times a week.      Follow-up Information    Jacqulynn Cadet, MD Follow up in 4 day(s).   Specialties: Interventional Radiology, Radiology Why: Please follow up with Dr. Laurence Ferrari for televisit in 4 weeks. Our office will call you to set this appointment up.  Contact information: [address] [place] [address] [id number]            Electronically Signed: Theresa Duty, NP 03/05/2020, 11:02 AM   I have spent Greater Than 30 Minutes discharging Waldon Merl.

## 2020-03-05 NOTE — Discharge Instructions (Signed)
Radiofrequency Ablation of Liver Tumors, Care After This sheet gives you information about how to care for yourself after your procedure. Your health care provider may also give you more specific instructions. If you have problems or questions, contact your health care provider. What can I expect after the procedure? After your procedure, it is common to have pain and discomfort in the upper abdomen. You will be given pain medicines to control this. Follow these instructions at home: Incision care   Follow instructions from your health care provider about how to take care of your incision. Make sure you: ? Wash your hands with soap and water before you change your bandage (dressing). If soap and water are not available, use hand sanitizer. ? Ok to remove bandaid after first shower; please keep area clean and dry.  Check your incision area every day for signs of infection. Check for: ? Redness, swelling, or pain. ? Fluid or blood. ? Warmth. ? Pus or a bad smell. Medicines  Take over-the-counter and prescription medicines only as told by your health care provider. Activity  Rest as often as needing during the first few days of recovery.  Do not lift anything that is heavier than 10 lbs (4.5 kg), or the limit that your health care provider tells you, until he or she says that it is safe.  Do not drive for 24 hours after the procedure if you were given a medicine to help you relax (sedative). General instructions  Ok to shower after 24 hours post-discharge. No bathing, pools, hot tubs, etc for 7 days post-discharge.   Follow any special diet instructions as directed by your health care provider.  Do not use any products that contain nicotine or tobacco, such as cigarettes and e-cigarettes. If you need help quitting, ask your health care provider.  Keep all follow-up visits as told by your health care provider. This is important. Contact a health care provider if:  You cannot pass  gas.  You are unable to have a bowel movement within 2 days.  You have a skin rash. Get help right away if:  You have a fever.  You have severe or lasting pain in your abdomen, shoulder, or back.  You have trouble swallowing or breathing.  You have severe weakness or dizziness.  You have chest pain or shortness of breath.  This information is not intended to replace advice given to you by your health care provider. Make sure you discuss any questions you have with your health care provider. Document Revised: 10/13/2017 Document Reviewed: 01/19/2017 Elsevier Patient Education  [place].

## 2020-03-10 ENCOUNTER — Other Ambulatory Visit: Payer: Self-pay | Admitting: *Deleted

## 2020-03-10 DIAGNOSIS — C220 Liver cell carcinoma: Secondary | ICD-10-CM

## 2020-03-13 ENCOUNTER — Other Ambulatory Visit: Payer: Self-pay | Admitting: Gastroenterology

## 2020-03-13 NOTE — Telephone Encounter (Signed)
Patient states his pharmacy informed him that he is in the donut hole and his nadolol is $15 instead of $2. Patient wants to know why. Informed patient that he will pay his way out of the donut hole unfortunately and he can call his insurance company if he has questions. Patient verbalized understanding.

## 2020-04-09 ENCOUNTER — Ambulatory Visit
Admission: RE | Admit: 2020-04-09 | Discharge: 2020-04-09 | Disposition: A | Discharge status: Home / Self Care | Payer: Medicare HMO | Source: Ambulatory Visit | Attending: Student | Admitting: Student

## 2020-04-09 ENCOUNTER — Other Ambulatory Visit: Payer: Self-pay

## 2020-04-09 ENCOUNTER — Encounter: Payer: Self-pay | Admitting: *Deleted

## 2020-04-09 DIAGNOSIS — C220 Liver cell carcinoma: Secondary | ICD-10-CM

## 2020-04-09 HISTORY — PX: IR RADIOLOGIST EVAL & MGMT: IMG5224

## 2020-04-09 NOTE — Progress Notes (Signed)
INTERVENTIONAL RADIOLOGY PROGRESS NOTE   Chief Complaint: Patient was seen in follow-up remotely today (TeleHealth) for hepatocellular carcinoma at the request of C[place],Jamie R.    Referring Physician(s): Roosevelt Locks, NP  History of Present Illness: Fernando Ryan is a 69 y.o. 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  He has been sober since this past November 2020.  He recently completed 12-week antiviral therapy with Epclusa for his hepatitis C.  MR imaging from 12/28/2019 demonstrates a 2.1 x 1.7 cm lesion in the posterior and medial aspect of hepatic segment 6.  Lesion is consistent with a Li-RADS category 5, diagnostic for hepatocellular carcinoma.    He underwent percutaneous thermal ablation on 03/04/2020.  Today, we meet via teleconference for follow-up.  He is pleased to report that his postprocedural course was completely uneventful.  He reports no pain, discomfort or other ill effects related to the procedure.  In fact, he has not even been able to identify the point of entry for the ablation probes.  His appetite is good.  He has no significant fatigue, fever, chills or other systemic complaints.  Past Medical History:  Diagnosis Date  . Hepatitis    Hep C  . Hypertension     Past Surgical History:  Procedure Laterality Date  . COLONOSCOPY WITH PROPOFOL N/A 01/07/2020   Procedure: COLONOSCOPY WITH PROPOFOL;  Surgeon: Mauri Pole, MD;  Location: [facility];  Service: Endoscopy;  Laterality: N/A;  . ESOPHAGOGASTRODUODENOSCOPY (EGD) WITH PROPOFOL N/A 01/07/2020   Procedure: ESOPHAGOGASTRODUODENOSCOPY (EGD) WITH PROPOFOL;  Surgeon: Mauri Pole, MD;  Location: [facility];  Service:  Endoscopy;  Laterality: N/A;  . FEMUR FRACTURE SURGERY    . HEMOSTASIS CLIP PLACEMENT  01/07/2020   Procedure: HEMOSTASIS CLIP PLACEMENT;  Surgeon: Mauri Pole, MD;  Location: [facility];  Service: Endoscopy;;  . IR RADIOLOGIST EVAL & MGMT  01/28/2020  . POLYPECTOMY  01/07/2020   Procedure: POLYPECTOMY;  Surgeon: Mauri Pole, MD;  Location: [facility];  Service: Endoscopy;;  . RADIOLOGY WITH ANESTHESIA N/A 03/04/2020   Procedure: CT WITH ANESTHESIA  MICROWAVE ABLATION;  Surgeon: Jacqulynn Cadet, MD;  Location: [facility];  Service: Anesthesiology;  Laterality: N/A;    Allergies: Patient has no known allergies.  Medications: Prior to Admission medications   Medication Sig Start Date End Date Taking? Authorizing Provider  Cyanocobalamin (VITAMIN B 12) 500 MCG TABS Take 500 mg by mouth 3 (three) times a week.     [provider]  levothyroxine (SYNTHROID) 50 MCG tablet Take 50 mcg by mouth daily.  12/23/19   [provider]  lisinopril (ZESTRIL) 10 MG tablet Take 10 mg by mouth daily. 12/23/19   [provider]  nadolol (CORGARD) 20 MG tablet Take 1 tablet (20 mg total) by mouth daily. 01/07/20 01/06/21  Mauri Pole, MD     Family History  Problem Relation Age of Onset  . Diabetes Mother   . Heart disease Mother   . Heart disease Father   . CAD Brother   . Colon cancer Neg Hx   . Esophageal cancer Neg Hx   . Pancreatic cancer Neg Hx   . Stomach cancer Neg Hx     Social History   Socioeconomic History  .  Marital status: Single    Spouse name: Not on file  . Number of children: 1  . Years of education: Not on file  . Highest education level: Not on file  Occupational History  . Occupation: Tile    Comment: part time   Tobacco Use  . Smoking status: Current Every Day Smoker    Packs/day: 0.50    Years: 45.00    Pack years: 22.50    Types: Cigarettes  . Smokeless tobacco: Never Used  Substance and Sexual Activity  . Alcohol  use: Not Currently    Comment: quit 09/2019, previous 40 year history 12 drinks daily   . Drug use: Not Currently  . Sexual activity: Not on file  Other Topics Concern  . Not on file  Social History Narrative  . Not on file   Social Determinants of Health   Financial Resource Strain:   . Difficulty of Paying Living Expenses:   Food Insecurity:   . Worried About Charity fundraiser in the Last Year:   . Arboriculturist in the Last Year:   Transportation Needs:   . Film/video editor (Medical):   Marland Kitchen Lack of Transportation (Non-Medical):   Physical Activity:   . Days of Exercise per Week:   . Minutes of Exercise per Session:   Stress:   . Feeling of Stress :   Social Connections:   . Frequency of Communication with Friends and Family:   . Frequency of Social Gatherings with Friends and Family:   . Attends Religious Services:   . Active Member of Clubs or Organizations:   . Attends Archivist Meetings:   Marland Kitchen Marital Status:     ECOG Status: 0 - Asymptomatic  Review of Systems  Review of Systems: A 12 point ROS discussed and pertinent positives are indicated in the HPI above.  All other systems are negative.  Physical Exam No direct physical exam was performed (except for noted visual exam findings with Video Visits).   Vital Signs: There were no vitals taken for this visit.  Imaging: No results found.  Labs:  CBC: Recent Labs    12/31/19 1554 02/27/20 1509 03/04/20 1005  WBC 5.3 5.6 5.0  HGB 11.9* 11.6* 11.7*  HCT 34.0* 33.2* 33.5*  PLT 61.0* 64* 71*    COAGS: Recent Labs    12/31/19 1554 03/04/20 1005  INR 1.2* 1.1    BMP: Recent Labs    12/31/19 1554 03/04/20 1005  NA 137 138  K 4.6 4.6  CL 105 107  CO2 27 23  GLUCOSE 83 106*  BUN 23 26*  CALCIUM 10.0 9.7  CREATININE 0.91 0.87  GFRNONAA  --  >60  GFRAA  --  >60    LIVER FUNCTION TESTS: Recent Labs    12/31/19 1554 03/04/20 1005  BILITOT 0.5 0.6  AST 34 35  ALT 21  21  ALKPHOS 86 81  PROT 8.3 8.0  ALBUMIN 4.2 3.4*    TUMOR MARKERS: Recent Labs    12/31/19 1554  AFPTM 5.3  CEA 12.9*  CA199 5    Assessment and Plan:  Doing extremely well 1 month status post percutaneous thermal ablation of a 2.1 cm hepatocellular carcinoma.  His recovery has been uneventful and he has no ill effects.  We will continue active surveillance.  1.)  Repeat MRI of the abdomen in late July (3 months post procedure) with accompanying clinic visit.    Electronically Signed: Jacqulynn Cadet 04/09/2020,  3:38 PM   I spent a total of  10 Minutes in remote  clinical consultation, greater than 50% of which was counseling/coordinating care for hepatocellular cancer.    Visit type: Audio only (telephone). Audio (no video) only due to patient preference. Alternative for in-person consultation at [facility], [address] [place], [place], [place]. This visit type was conducted due to national recommendations for restrictions regarding the COVID-19 Pandemic (e.g. social distancing).  This format is felt to be most appropriate for this patient at this time.  All issues noted in this document were discussed and addressed.

## 2020-05-20 ENCOUNTER — Other Ambulatory Visit: Payer: Self-pay

## 2020-05-20 ENCOUNTER — Emergency Department (HOSPITAL_COMMUNITY): Payer: Medicare HMO

## 2020-05-20 ENCOUNTER — Emergency Department (HOSPITAL_COMMUNITY)
Admission: EM | Admit: 2020-05-20 | Discharge: 2020-05-20 | Disposition: A | Discharge status: Home / Self Care | Payer: Medicare HMO | Attending: Emergency Medicine | Admitting: Emergency Medicine

## 2020-05-20 ENCOUNTER — Encounter (HOSPITAL_COMMUNITY): Payer: Self-pay | Admitting: Emergency Medicine

## 2020-05-20 DIAGNOSIS — R0602 Shortness of breath: Secondary | ICD-10-CM | POA: Diagnosis not present

## 2020-05-20 DIAGNOSIS — F17210 Nicotine dependence, cigarettes, uncomplicated: Secondary | ICD-10-CM | POA: Insufficient documentation

## 2020-05-20 DIAGNOSIS — I10 Essential (primary) hypertension: Secondary | ICD-10-CM | POA: Insufficient documentation

## 2020-05-20 DIAGNOSIS — Z79899 Other long term (current) drug therapy: Secondary | ICD-10-CM | POA: Diagnosis not present

## 2020-05-20 DIAGNOSIS — R5383 Other fatigue: Secondary | ICD-10-CM | POA: Insufficient documentation

## 2020-05-20 DIAGNOSIS — M542 Cervicalgia: Secondary | ICD-10-CM | POA: Diagnosis not present

## 2020-05-20 DIAGNOSIS — M545 Low back pain, unspecified: Secondary | ICD-10-CM

## 2020-05-20 LAB — COMPREHENSIVE METABOLIC PANEL
ALT: 21 U/L (ref 0–44)
AST: 33 U/L (ref 15–41)
Albumin: 4 g/dL (ref 3.5–5.0)
Alkaline Phosphatase: 90 U/L (ref 38–126)
Anion gap: 9 (ref 5–15)
BUN: 37 mg/dL — ABNORMAL HIGH (ref 8–23)
CO2: 23 mmol/L (ref 22–32)
Calcium: 9.7 mg/dL (ref 8.9–10.3)
Chloride: 105 mmol/L (ref 98–111)
Creatinine, Ser: 1.35 mg/dL — ABNORMAL HIGH (ref 0.61–1.24)
GFR calc Af Amer: >60 mL/min (ref >60)
GFR calc non Af Amer: 53 mL/min — ABNORMAL LOW (ref >60)
Glucose, Bld: 115 mg/dL — ABNORMAL HIGH (ref 70–99)
Potassium: 4.2 mmol/L (ref 3.5–5.1)
Sodium: 137 mmol/L (ref 135–145)
Total Bilirubin: 0.8 mg/dL (ref 0.3–1.2)
Total Protein: 9.1 g/dL — ABNORMAL HIGH (ref 6.5–8.1)

## 2020-05-20 LAB — CBC WITH DIFFERENTIAL/PLATELET
Abs Immature Granulocytes: 0.02 10*3/uL (ref 0.00–0.07)
Basophils Absolute: 0 10*3/uL (ref 0.0–0.1)
Basophils Relative: 0 %
Eosinophils Absolute: 0.1 10*3/uL (ref 0.0–0.5)
Eosinophils Relative: 2 %
HCT: 35.8 % — ABNORMAL LOW (ref 39.0–52.0)
Hemoglobin: 12.7 g/dL — ABNORMAL LOW (ref 13.0–17.0)
Immature Granulocytes: 0 %
Lymphocytes Relative: 35 %
Lymphs Abs: 1.7 10*3/uL (ref 0.7–4.0)
MCH: 33.1 pg (ref 26.0–34.0)
MCHC: 35.5 g/dL (ref 30.0–36.0)
MCV: 93.2 fL (ref 80.0–100.0)
Monocytes Absolute: 0.8 10*3/uL (ref 0.1–1.0)
Monocytes Relative: 15 %
Neutro Abs: 2.3 10*3/uL (ref 1.7–7.7)
Neutrophils Relative %: 48 %
Platelets: 69 10*3/uL — ABNORMAL LOW (ref 150–400)
RBC: 3.84 MIL/uL — ABNORMAL LOW (ref 4.22–5.81)
RDW: 12.4 % (ref 11.5–15.5)
WBC: 4.9 10*3/uL (ref 4.0–10.5)
nRBC: 0 % (ref 0.0–0.2)

## 2020-05-20 MED ORDER — METHOCARBAMOL 500 MG PO TABS: 500.0000 mg | ORAL_TABLET | Freq: Two times a day (BID) | ORAL | 0 refills | Status: DC

## 2020-05-20 NOTE — Discharge Instructions (Addendum)
Your x-rays were unremarkable today.  This is a reassuring finding.  Your lab work was also reassuring-you do appear to be dehydrated I recommend drinking plenty of water.  Please follow-up with your primary care doctor for reevaluation and continued investigation into your fatigue.  You were given a prescription for robaxin which is a muscle relaxer.  You should not drive, work, consume alcohol, or operate machinery while taking this medication as it can make you very drowsy.

## 2020-05-20 NOTE — ED Provider Notes (Signed)
[facility] DEPT Provider Note   CSN: [id number] Arrival date & time: 05/20/20  0746     History Chief Complaint  Patient presents with  . Back Pain  . Neck Pain  . Arm Pain    Fernando Ryan is a 69 y.o. male.  HPI  Patient is a 69 year old male with a past medical history of hepatitis, cirrhosis, hepatocellular carcinoma, esophageal varices without bleeding.  Patient presents today for several months of general fatigue.  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  He states it is achy midline and associated with a popping sensation.  He also endorses some neck pain which he states is been ongoing for about 5 weeks.  He states that he has had no fevers, no abdominal pain, no chest pain.  He does state that he has felt more fatigued for the past 5 weeks as well and endorses some mild shortness of breath.  Patient is followed by a hepatologist as well as his oncologist and primary care doctor Dr. Leroy Sea.  He has not addressed these sx with his primary care team.  He denies any history of IV drug use, denies any blood thinners denies any trauma.    Past Medical History:  Diagnosis Date  . Hepatitis    Hep C  . Hypertension     Patient Active Problem List   Diagnosis Date Noted  . Hepatocellular carcinoma ([place]) 03/04/2020  . Malignant neoplasm of liver, primary ([place]) 01/30/2020  . Esophageal varices without bleeding ([place])   . Hepatitis C virus infection without hepatic coma   . Dieulafoy lesion of stomach   . Special screening for malignant neoplasms, colon   . Polyp of cecum   . Polyp of descending colon     Past Surgical History:  Procedure Laterality Date  . COLONOSCOPY WITH PROPOFOL N/A 01/07/2020   Procedure: COLONOSCOPY WITH PROPOFOL;  Surgeon: Mauri Pole, MD;  Location: [facility];  Service: Endoscopy;  Laterality: N/A;  . ESOPHAGOGASTRODUODENOSCOPY (EGD) WITH PROPOFOL N/A 01/07/2020    Procedure: ESOPHAGOGASTRODUODENOSCOPY (EGD) WITH PROPOFOL;  Surgeon: Mauri Pole, MD;  Location: [facility];  Service: Endoscopy;  Laterality: N/A;  . FEMUR FRACTURE SURGERY    . HEMOSTASIS CLIP PLACEMENT  01/07/2020   Procedure: HEMOSTASIS CLIP PLACEMENT;  Surgeon: Mauri Pole, MD;  Location: [facility];  Service: Endoscopy;;  . IR RADIOLOGIST EVAL & MGMT  01/28/2020  . IR RADIOLOGIST EVAL & MGMT  04/09/2020  . POLYPECTOMY  01/07/2020   Procedure: POLYPECTOMY;  Surgeon: Mauri Pole, MD;  Location: [facility];  Service: Endoscopy;;  . RADIOLOGY WITH ANESTHESIA N/A 03/04/2020   Procedure: CT WITH ANESTHESIA  MICROWAVE ABLATION;  Surgeon: Jacqulynn Cadet, MD;  Location: [facility];  Service: Anesthesiology;  Laterality: N/A;       Family History  Problem Relation Age of Onset  . Diabetes Mother   . Heart disease Mother   . Heart disease Father   . CAD Brother   . Colon cancer Neg Hx   . Esophageal cancer Neg Hx   . Pancreatic cancer Neg Hx   . Stomach cancer Neg Hx     Social History   Tobacco Use  . Smoking status: Current Every Day Smoker    Packs/day: 0.50    Years: 45.00    Pack years: 22.50    Types: Cigarettes  . Smokeless tobacco: Never Used  Vaping Use  . Vaping Use:  Never used  Substance Use Topics  . Alcohol use: Not Currently    Comment: quit 09/2019, previous 40 year history 12 drinks daily   . Drug use: Not Currently    Home Medications Prior to Admission medications   Medication Sig Start Date End Date Taking? Authorizing Provider  Cyanocobalamin (VITAMIN B 12) 500 MCG TABS Take 500 mg by mouth 3 (three) times a week.     [provider]  levothyroxine (SYNTHROID) 50 MCG tablet Take 50 mcg by mouth daily.  12/23/19   [provider]  lisinopril (ZESTRIL) 10 MG tablet Take 10 mg by mouth daily. 12/23/19   [provider]  methocarbamol (ROBAXIN) 500 MG tablet Take 1 tablet (500 mg total) by mouth 2 (two) times  daily. 05/20/20   Tedd Sias, PA  nadolol (CORGARD) 20 MG tablet Take 1 tablet (20 mg total) by mouth daily. 01/07/20 01/06/21  Mauri Pole, MD    Allergies    Patient has no known allergies.  Review of Systems   Review of Systems  Constitutional: Negative for chills and fever.  HENT: Negative for congestion.   Respiratory: Positive for shortness of breath. Negative for chest tightness.   Cardiovascular: Negative for chest pain.  Gastrointestinal: Negative for abdominal pain.  Musculoskeletal: Positive for back pain. Negative for neck pain.    Physical Exam Updated Vital Signs BP 116/65 (BP Location: Right Arm)   Pulse 68   Temp 98.6 F (37 C) (Oral)   Resp 18   SpO2 100%   Physical Exam Vitals and nursing note reviewed.  Constitutional:      General: He is not in acute distress.    Comments: Pleasant 69 year old male in no acute distress comfortable in bed. Able to answer questions appropriately and follow commands.  HENT:     Head: Normocephalic and atraumatic.     Nose: Nose normal.  Eyes:     General: No scleral icterus. Neck:     Comments: Mild diffuse cervical spine tenderness to palpation.  There is also paracervical muscular tenderness. Cardiovascular:     Rate and Rhythm: Normal rate and regular rhythm.     Pulses: Normal pulses.     Heart sounds: Normal heart sounds.  Pulmonary:     Effort: Pulmonary effort is normal. No respiratory distress.     Breath sounds: No wheezing.  Abdominal:     Palpations: Abdomen is soft.     Tenderness: There is no abdominal tenderness.  Musculoskeletal:        General: Normal range of motion.     Cervical back: Normal range of motion. Tenderness present.     Right lower leg: No edema.     Left lower leg: No edema.     Comments: Tenderness to palpation of the lumbar spine midline.  There is more significant paralumbar muscular tenderness.  Skin:    General: Skin is warm and dry.     Capillary Refill: Capillary  refill takes less than 2 seconds.     Comments: Very dry skin  Neurological:     Mental Status: He is alert. Mental status is at baseline.  Psychiatric:        Mood and Affect: Mood normal.        Behavior: Behavior normal.     ED Results / Procedures / Treatments   Labs (all labs ordered are listed, but only abnormal results are displayed) Labs Reviewed  CBC WITH DIFFERENTIAL/PLATELET - Abnormal; Notable for the  following components:      Result Value   RBC 3.84 (*)    Hemoglobin 12.7 (*)    HCT 35.8 (*)    Platelets 69 (*)    All other components within normal limits  COMPREHENSIVE METABOLIC PANEL - Abnormal; Notable for the following components:   Glucose, Bld 115 (*)    BUN 37 (*)    Creatinine, Ser 1.35 (*)    Total Protein 9.1 (*)    GFR calc non Af Amer 53 (*)    All other components within normal limits    EKG None  Radiology DG Chest 2 View  Result Date: 05/20/2020 CLINICAL DATA:  Neck pain, RIGHT shoulder and arm pain, low back pain, 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 Lungs clear. No pulmonary infiltrate, pleural effusion or pneumothorax. Osseous structures unremarkable. IMPRESSION: No acute abnormalities. Electronically Signed   By: Lavonia Dana M.D.   On: 05/20/2020 10:29   DG Cervical Spine Complete  Result Date: 05/20/2020 CLINICAL DATA:  Back pain.  Additional provided: Lifting injury. EXAM: CERVICAL SPINE - COMPLETE 4+ VIEW COMPARISON:  No pertinent prior studies available for comparison. FINDINGS: Reversal of the expected cervical lordosis. C5-C6 grade 1 retrolisthesis There is mild age-indeterminate C5 vertebral body height loss. Moderate/advanced disc space narrowing at the C3-C4 through C7-T1 levels. Multilevel uncovertebral hypertrophy and facet arthrosis. Multilevel bony neural foraminal narrowing greatest on the right at  C3-C4, C5-C6 and C6-C7 and on the left at C3-C4 C5-C6 and C6-C7. Unremarkable appearance of the C1-C2 articulation on the dedicated odontoid view. IMPRESSION: Age-indeterminate mild C5 vertebral body height loss. Reversal of the expected cervical lordosis. Mild C5-C6 grade 1 retrolisthesis. Cervical spondylosis as outlined. Electronically Signed   By: Kellie Simmering DO   On: 05/20/2020 10:29   DG Lumbar Spine Complete  Result Date: 05/20/2020 CLINICAL DATA:  Back pain.  Additional provided: Lifting injury. EXAM: LUMBAR SPINE - COMPLETE 4+ VIEW COMPARISON:  No pertinent prior studies available for comparison. FINDINGS: Five lumbar vertebrae. Lumbar levocurvature. Mild L1-L2, L2-L3 and L3-L4 grade 1 retrolisthesis. No lumbar compression deformity. Multilevel disc space narrowing. Most notably, there is moderate/advanced L5-S1 disc space narrowing. Facet arthrosis, also greatest at L5-S1. IMPRESSION: No lumbar compression fracture. Lumbar levocurvature.  Mild multilevel grade 1 retrolisthesis. Lumbar spondylosis as outlined and greatest at L5-S1. Electronically Signed   By: Kellie Simmering DO   On: 05/20/2020 10:26    Procedures Procedures (including critical care time)  Medications Ordered in ED Medications - No data to display  ED Course  I have reviewed the triage vital signs and the nursing notes.  Pertinent labs & imaging results that were available during my care of the patient were reviewed by me and considered in my medical decision making (see chart for details).  Patient 69 year old male with history of hepatocellular carcinoma and cirrhosis with esophageal varices without bleeding presented today for several months of general fatigue he is also complaining of low back pain which seems to have been elicited by laying down tile.  He states that the pain is intermittent and worse with movement and heavy lifting.  Physical exam is relatively unremarkable.  Patient is not appear to be in any acute  distress.  He is deferring pain medication at this time.  He does have diffuse back tenderness to palpation.  Low suspicion for mets but will obtain x-rays to sprain  and recommend close follow-up with PCP and oncologist.  I discussed this case with my attending physician who cosigned this note including patient's presenting symptoms, physical exam, and planned diagnostics and interventions. Attending physician stated agreement with plan or made changes to plan which were implemented.   Attending physician assessed patient at bedside.   Clinical Course as of May 20 1540  Wed May 20, 2020  1048 CBC unchanged from baseline.  Anemia at baseline and low platelet level at baseline.  Patient is chronically thrombocytopenic.   [WF]  1049 Plain films of C-spine, chest and lumbar spine reviewed myself.  Agree with radiology read.  There is no evidence of obvious mets.  No fractures or acute abnormalities.  CMP with evidence of dehydration patient does have mildly elevated BUN and moderately elevated creatinine.  He has had no nausea or vomiting --able to eat and drink without difficulty..  Will recommend increasing hydration.     [WF]    Clinical Course User Index [WF] Tedd Sias, PA   MDM Rules/Calculators/A&P                          Broad differential for back pain considered includes malignancy, disc herniation, spinal epidural abscess, spinal fracture, cauda equina, pyelonephritis, kidney stone, AAA, AD, pancreatitis, PE and PTX.   History without symptoms of urinary or stool retention or incontinence, neurologic changes such as sensation change or weakness lower extremities, coagulopathy or blood thinner use, is not elderly or with history of osteoporosis, denies any history of cancer, fever, IV drug use, weight changes (unexplained), or prolonged steroid use.   Physical exam most consistent with muscular strain. Doubt cauda equina or disc herniation d/t lack of saddle anesthesia/bowel  or bladder incontinence or urinary retention, normal gait and reassuring physical examination without neurologic deficits.   History is not supportive of kidney stone, AAA, AD, pancreatitis, PE or PTX. Patient has no CVA tenderness or urinary sx to suggest pyelonephritis or kidney stone.   Will manage patient conservatively at this time. NSAIDs, back exercises/stretches, heat therapy and follow up with PCP if symptoms do not resolve in 3-4 weeks. Patient offered muscle relaxer for comfort at night. Counseled on need to return to ED for fever, worsening or concerning symptoms. Patient agreeable to plan and states understanding of follow up plans and return precautions.    Patient states he will drink plenty of water.  He is understanding of plan to discharge with close follow-up with PCP, oncologist and hepatologist for further work-up of his fatigue.  Patient discharged with Robaxin.  Final Clinical Impression(s) / ED Diagnoses Final diagnoses:  Acute low back pain without sciatica, unspecified back pain laterality  Neck pain  Fatigue, unspecified type    Rx / DC Orders ED Discharge Orders         Ordered    methocarbamol (ROBAXIN) 500 MG tablet  2 times daily     Discontinue  Reprint     05/20/20 1052           Santiago Stenzel [place], [place] 05/20/20 1542    Isla Pence, MD 05/20/20 1544

## 2020-05-20 NOTE — ED Triage Notes (Signed)
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 Worse at times.

## 2020-06-17 ENCOUNTER — Other Ambulatory Visit: Payer: Self-pay | Admitting: Interventional Radiology

## 2020-06-17 DIAGNOSIS — C220 Liver cell carcinoma: Secondary | ICD-10-CM

## 2020-06-26 ENCOUNTER — Other Ambulatory Visit: Payer: Self-pay | Admitting: Nurse Practitioner

## 2020-06-26 DIAGNOSIS — C220 Liver cell carcinoma: Secondary | ICD-10-CM

## 2020-07-10 ENCOUNTER — Telehealth: Payer: Self-pay | Admitting: *Deleted

## 2020-07-10 MED ORDER — NADOLOL 20 MG PO TABS: 20.0000 mg | ORAL_TABLET | Freq: Every day | ORAL | 3 refills | Status: DC

## 2020-07-10 NOTE — Telephone Encounter (Signed)
Nadolol sent to patients mail order pharmacy

## 2020-07-16 ENCOUNTER — Telehealth: Payer: Self-pay | Admitting: Gastroenterology

## 2020-07-16 MED ORDER — NADOLOL 20 MG PO TABS: 20.0000 mg | ORAL_TABLET | Freq: Every day | ORAL | 3 refills | Status: DC

## 2020-07-16 NOTE — Telephone Encounter (Signed)
Resent Nadolol to patients mail order pharmacy, this was snt to them on the 27 th electronically but they didn't receive the script

## 2020-07-25 ENCOUNTER — Ambulatory Visit
Admission: RE | Admit: 2020-07-25 | Discharge: 2020-07-25 | Disposition: A | Discharge status: Home / Self Care | Payer: Medicare HMO | Source: Ambulatory Visit | Attending: Nurse Practitioner | Admitting: Nurse Practitioner

## 2020-07-25 DIAGNOSIS — C220 Liver cell carcinoma: Secondary | ICD-10-CM

## 2020-07-25 MED ORDER — GADOXETATE DISODIUM 0.25 MMOL/ML IV SOLN
8.0000 mL | Freq: Once | INTRAVENOUS | Status: AC | PRN
  Administered 2020-07-25: 8 mL via INTRAVENOUS

## 2020-08-07 ENCOUNTER — Inpatient Hospital Stay: Payer: Medicare HMO | Attending: Hematology | Admitting: Hematology

## 2020-08-07 ENCOUNTER — Inpatient Hospital Stay: Payer: Medicare HMO

## 2020-08-26 ENCOUNTER — Other Ambulatory Visit: Payer: Self-pay

## 2020-08-26 ENCOUNTER — Ambulatory Visit
Admission: RE | Admit: 2020-08-26 | Discharge: 2020-08-26 | Disposition: A | Discharge status: Home / Self Care | Payer: Medicare HMO | Source: Ambulatory Visit | Attending: Interventional Radiology | Admitting: Interventional Radiology

## 2020-08-26 ENCOUNTER — Encounter: Payer: Self-pay | Admitting: *Deleted

## 2020-08-26 DIAGNOSIS — C220 Liver cell carcinoma: Secondary | ICD-10-CM

## 2020-08-26 HISTORY — PX: IR RADIOLOGIST EVAL & MGMT: IMG5224

## 2020-08-26 NOTE — Progress Notes (Signed)
Referring Physician(s): McCullough,Heath  Chief Complaint: The patient is seen in follow up today s/p microwave ablation of right lobe hepatocellular carcinoma  History of present illness: Fernando Ryan is a 69 y.o. male with alcoholic and HCV cirrhosis who initially presented at the kind request of DawnDrazek, hepatology Nurse Practitioner to discuss liver directed options for treatment of his newly discovered hepatocellular carcinoma.  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 Fernando has been sober since this past November 2020. Fernando recently completed 12-week antiviral therapy with Epclusa for his hepatitis C.   MR imaging from 12/28/2019 demonstrated a 2.1 x 1.7 cm lesion in the posterior and medial aspect of hepatic segment 6. Lesion was consistent with a Li-RADS category 5, diagnostic for hepatocellular carcinoma.   Fernando underwent percutaneous thermal ablation on 03/04/2020.  Today, we meet via teleconference for follow-up.  Fernando Ryan and feeling well.  Fernando reports no pain, discomfort or other ill effects related to the procedure.  His appetite is good.  Fernando has no significant fatigue, fever, chills or other systemic complaints.  Fernando plans to see NP Drazek in a couple weeks for Hepatology follow up.  Past Medical History:  Diagnosis Date  . Hepatitis    Hep C  . Hypertension     Past Surgical History:  Procedure Laterality Date  . COLONOSCOPY WITH PROPOFOL N/A 01/07/2020   Procedure: COLONOSCOPY WITH PROPOFOL;  Surgeon: Mauri Pole, MD;  Location: [facility];  Service: Endoscopy;  Laterality: N/A;  . ESOPHAGOGASTRODUODENOSCOPY (EGD) WITH PROPOFOL N/A 01/07/2020   Procedure: ESOPHAGOGASTRODUODENOSCOPY (EGD) WITH PROPOFOL;  Surgeon: Mauri Pole, MD;  Location: [facility];  Service: Endoscopy;  Laterality: N/A;  . FEMUR FRACTURE SURGERY    . HEMOSTASIS CLIP PLACEMENT  01/07/2020    Procedure: HEMOSTASIS CLIP PLACEMENT;  Surgeon: Mauri Pole, MD;  Location: [facility];  Service: Endoscopy;;  . IR RADIOLOGIST EVAL & MGMT  01/28/2020  . IR RADIOLOGIST EVAL & MGMT  04/09/2020  . POLYPECTOMY  01/07/2020   Procedure: POLYPECTOMY;  Surgeon: Mauri Pole, MD;  Location: [facility];  Service: Endoscopy;;  . RADIOLOGY WITH ANESTHESIA N/A 03/04/2020   Procedure: CT WITH ANESTHESIA  MICROWAVE ABLATION;  Surgeon: Jacqulynn Cadet, MD;  Location: [facility];  Service: Anesthesiology;  Laterality: N/A;    Allergies: Patient has no known allergies.  Medications: Prior to Admission medications   Medication Sig Start Date End Date Taking? Authorizing Provider  Cyanocobalamin (VITAMIN B 12) 500 MCG TABS Take 500 mg by mouth 3 (three) times a week.     [provider]  levothyroxine (SYNTHROID) 50 MCG tablet Take 50 mcg by mouth daily.  12/23/19   [provider]  lisinopril (ZESTRIL) 10 MG tablet Take 10 mg by mouth daily. 12/23/19   [provider]  methocarbamol (ROBAXIN) 500 MG tablet Take 1 tablet (500 mg total) by mouth 2 (two) times daily. 05/20/20   Tedd Sias, PA  nadolol (CORGARD) 20 MG tablet Take 1 tablet (20 mg total) by mouth daily. 07/16/20 07/16/21  Mauri Pole, MD     Family History  Problem Relation Age of Onset  . Diabetes Mother   . Heart disease Mother   . Heart disease Father   . CAD Brother   . Colon cancer Neg Hx   . Esophageal cancer Neg Hx   . Pancreatic cancer Neg Hx   . Stomach  cancer Neg Hx     Social History   Socioeconomic History  . Marital status: Single    Spouse name: Not on file  . Number of children: 1  . Years of education: Not on file  . Highest education level: Not on file  Occupational History  . Occupation: Tile    Comment: part time   Tobacco Use  . Smoking status: Current Every Day Smoker    Packs/day: 0.50    Years: 45.00    Pack years: 22.50    Types: Cigarettes  . Smokeless  tobacco: Never Used  Vaping Use  . Vaping Use: Never used  Substance and Sexual Activity  . Alcohol use: Not Currently    Comment: quit 09/2019, previous 40 year history 12 drinks daily   . Drug use: Not Currently  . Sexual activity: Not on file  Other Topics Concern  . Not on file  Social History Narrative  . Not on file   Social Determinants of Health   Financial Resource Strain:   . Difficulty of Paying Living Expenses: Not on file  Food Insecurity:   . Worried About Charity fundraiser in the Last Year: Not on file  . Ran Out of Food in the Last Year: Not on file  Transportation Needs:   . Lack of Transportation (Medical): Not on file  . Lack of Transportation (Non-Medical): Not on file  Physical Activity:   . Days of Exercise per Week: Not on file  . Minutes of Exercise per Session: Not on file  Stress:   . Feeling of Stress : Not on file  Social Connections:   . Frequency of Communication with Friends and Family: Not on file  . Frequency of Social Gatherings with Friends and Family: Not on file  . Attends Religious Services: Not on file  . Active Member of Clubs or Organizations: Not on file  . Attends Archivist Meetings: Not on file  . Marital Status: Not on file     Vital Signs: There were no vitals taken for this visit.    Imaging: MRI abdomen (07/25/20): LIRADS TR-nonviable, no new hepatomas   Labs:  CBC: Recent Labs    12/31/19 1554 02/27/20 1509 03/04/20 1005 05/20/20 1012  WBC 5.3 5.6 5.0 4.9  HGB 11.9* 11.6* 11.7* 12.7*  HCT 34.0* 33.2* 33.5* 35.8*  PLT 61.0* 64* 71* 69*    COAGS: Recent Labs    12/31/19 1554 03/04/20 1005  INR 1.2* 1.1    BMP: Recent Labs    12/31/19 1554 03/04/20 1005 05/20/20 1012  NA 137 138 137  K 4.6 4.6 4.2  CL 105 107 105  CO2 27 23 23   GLUCOSE 83 106* 115*  BUN 23 26* 37*  CALCIUM 10.0 9.7 9.7  CREATININE 0.91 0.87 1.35*  GFRNONAA  --  >60 53*  GFRAA  --  >60 >60    LIVER  FUNCTION TESTS: Recent Labs    12/31/19 1554 03/04/20 1005 05/20/20 1012  BILITOT 0.5 0.6 0.8  AST 34 35 33  ALT 21 21 21   ALKPHOS 86 81 90  PROT 8.3 8.0 9.1*  ALBUMIN 4.2 3.4* 4.0    Assessment and Plan: 69 year old male with history of hepatitis C and alcoholic cirrhosis with unifocal hepatocellular carcinoma status post microwave ablation in April 2021.  Fernando is recovering very well clinically with no imaging evidence of recurrence.  Plan for MRI abdomen in December (9 month post-procedure scan) with follow  up clinic visit.  Electronically Signed: Suzette Battiest 08/26/2020, 10:31 AM   I spent a total of 15 Minutes in telephone clinical consultation, greater than 50% of which was counseling/coordinating care for hepatocellular carcinoma.

## 2020-09-01 IMAGING — CR DG LUMBAR SPINE COMPLETE 4+V
5 series · 5 of 5 positions shown · non-contrast
Comparison: No pertinent prior studies available for comparison.

CLINICAL DATA: Back pain.  Additional provided: Lifting injury.

EXAM:
LUMBAR SPINE - COMPLETE 4+ VIEW

[t lumbar spine ap]
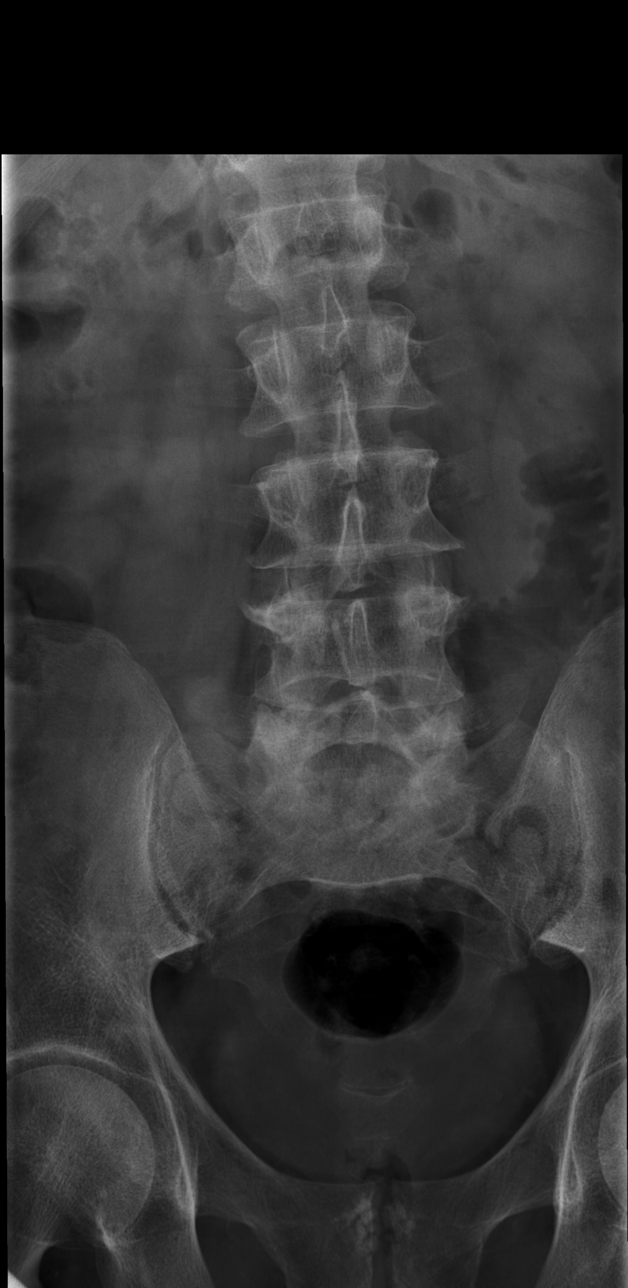

[t lumbar spine obl (1 of 2)]
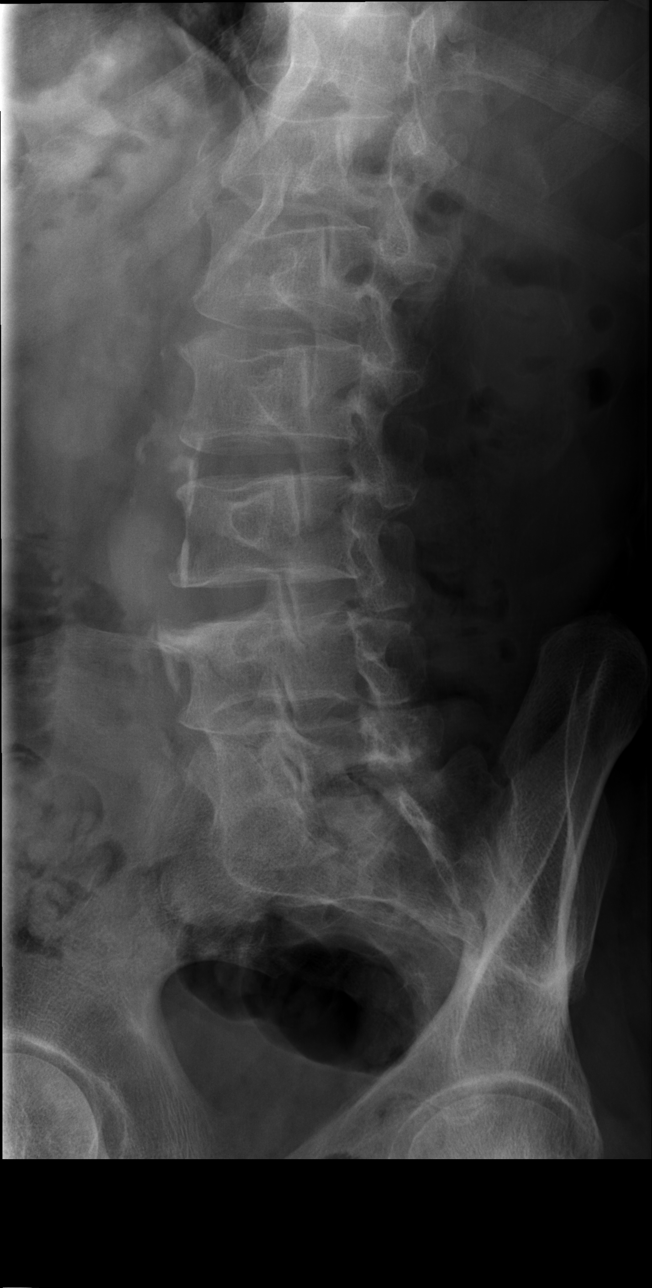

[t lumbar spine obl (2 of 2)]
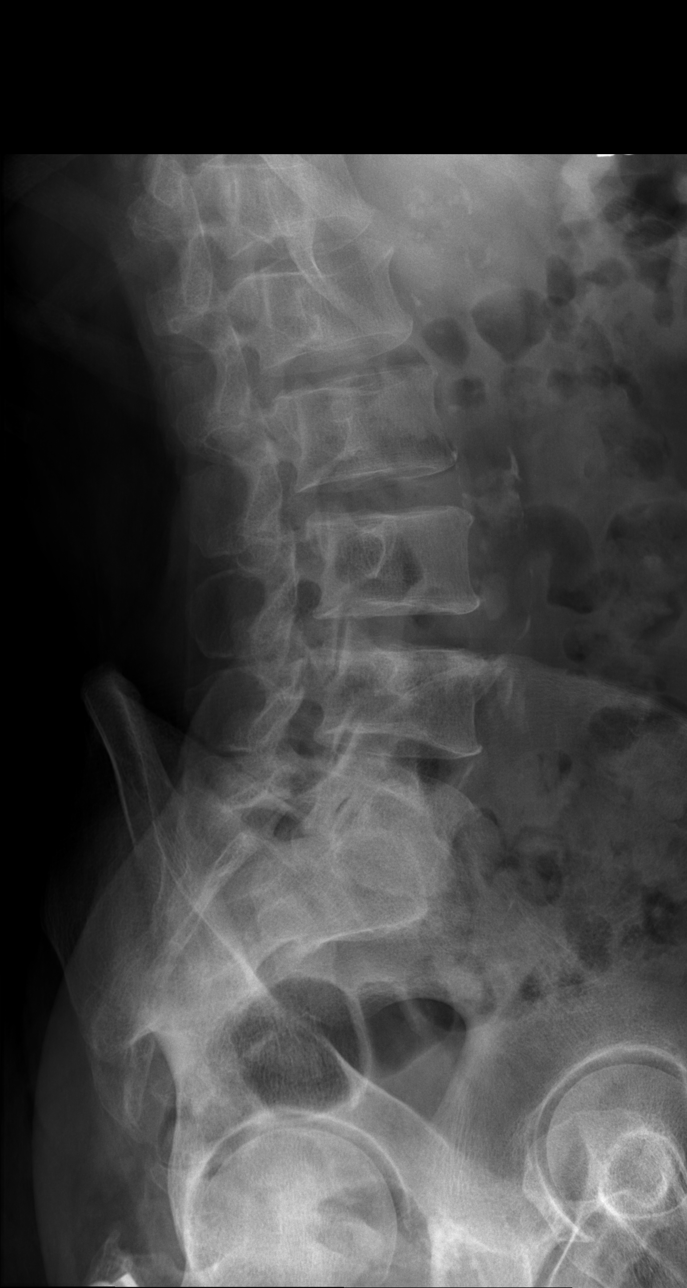

[t lumbar spine lat]
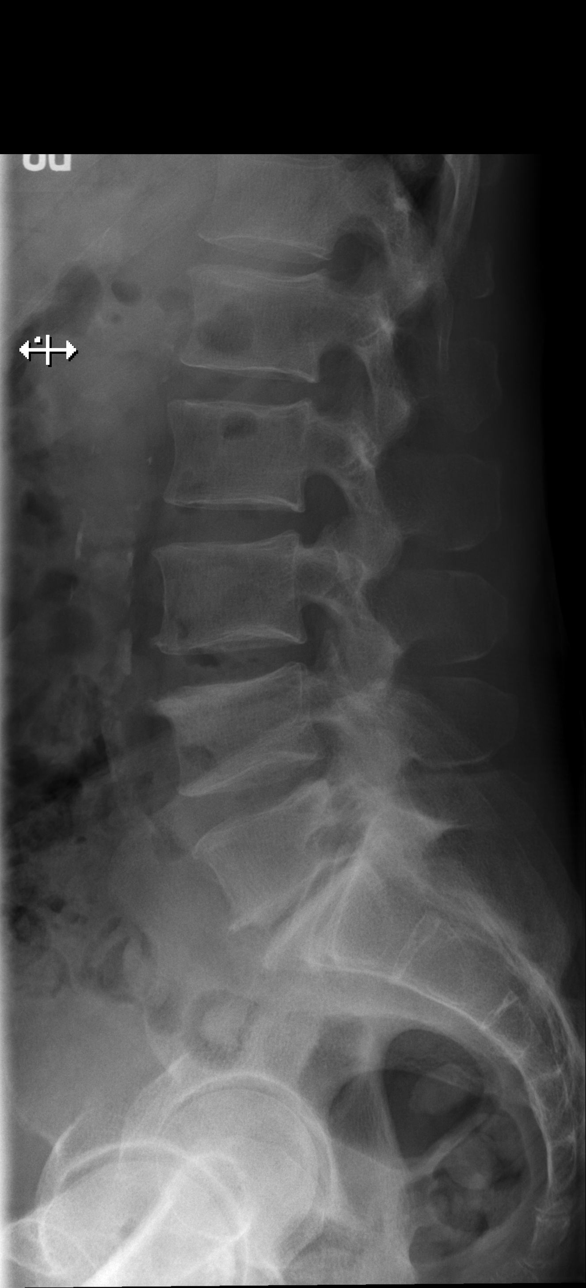

[t lumbar l-5 s-1 spot]
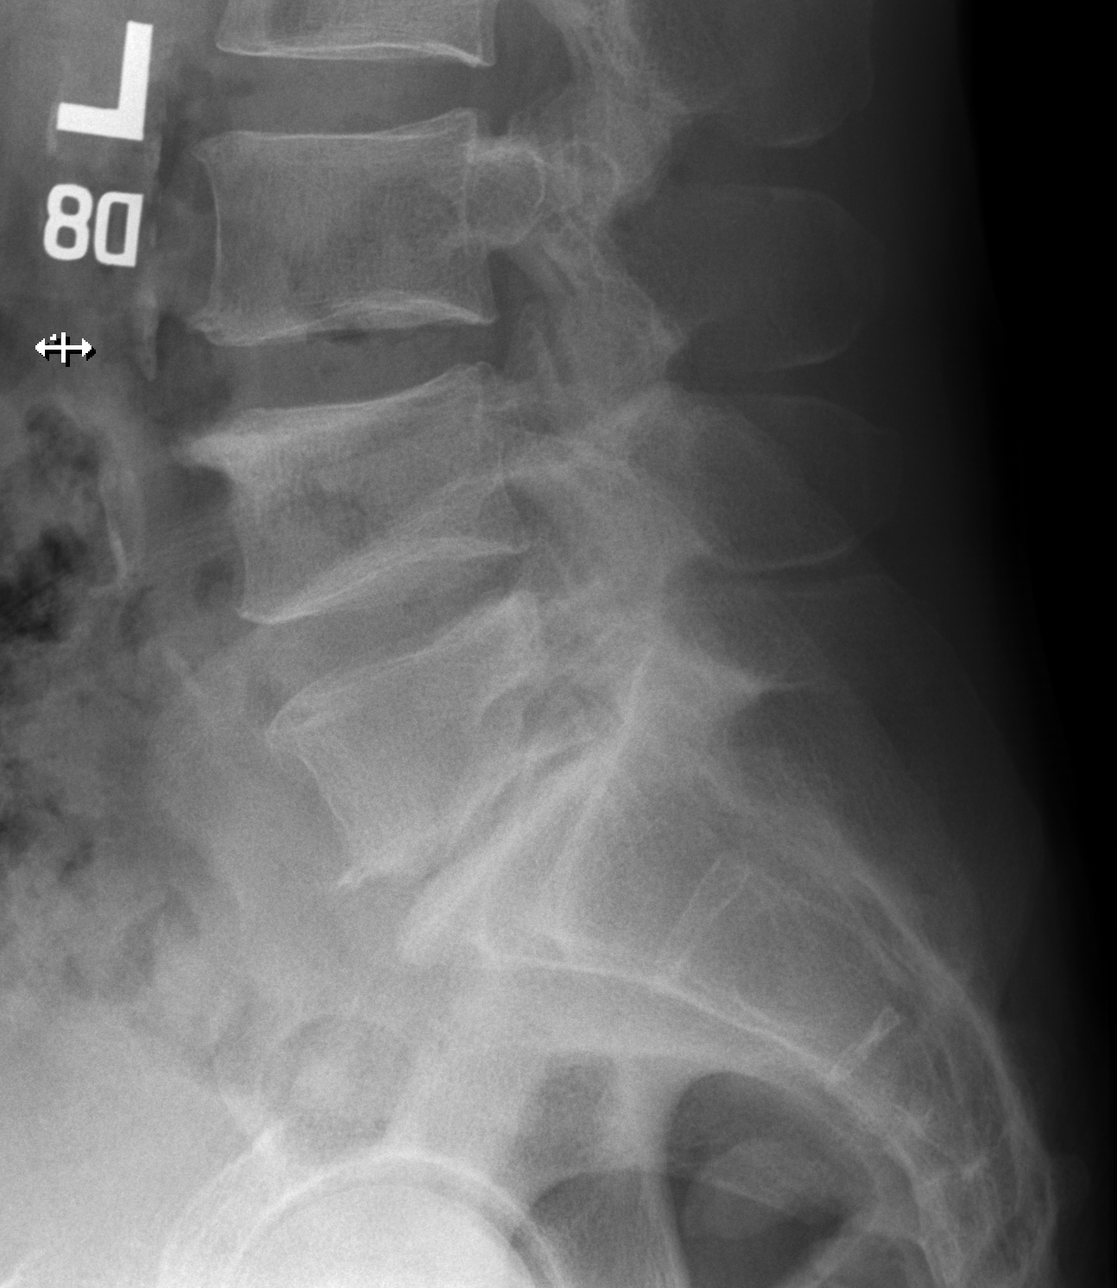

[5 of 5 positions shown; findings below may reference images not displayed]

FINDINGS: Five lumbar vertebrae.

Lumbar levocurvature. Mild L1-L2, L2-L3 and L3-L4 grade 1
retrolisthesis.

No lumbar compression deformity.

Multilevel disc space narrowing. Most notably, there is
moderate/advanced L5-S1 disc space narrowing. Facet arthrosis, also
greatest at L5-S1.
IMPRESSION: No lumbar compression fracture.

Lumbar levocurvature.  Mild multilevel grade 1 retrolisthesis.

Lumbar spondylosis as outlined and greatest at L5-S1.

## 2020-09-01 IMAGING — CR DG CERVICAL SPINE COMPLETE 4+V
6 series · 6 of 6 positions shown · non-contrast
Comparison: No pertinent prior studies available for comparison.

CLINICAL DATA: Back pain.  Additional provided: Lifting injury.

EXAM:
CERVICAL SPINE - COMPLETE 4+ VIEW

[w cervical spine lat]
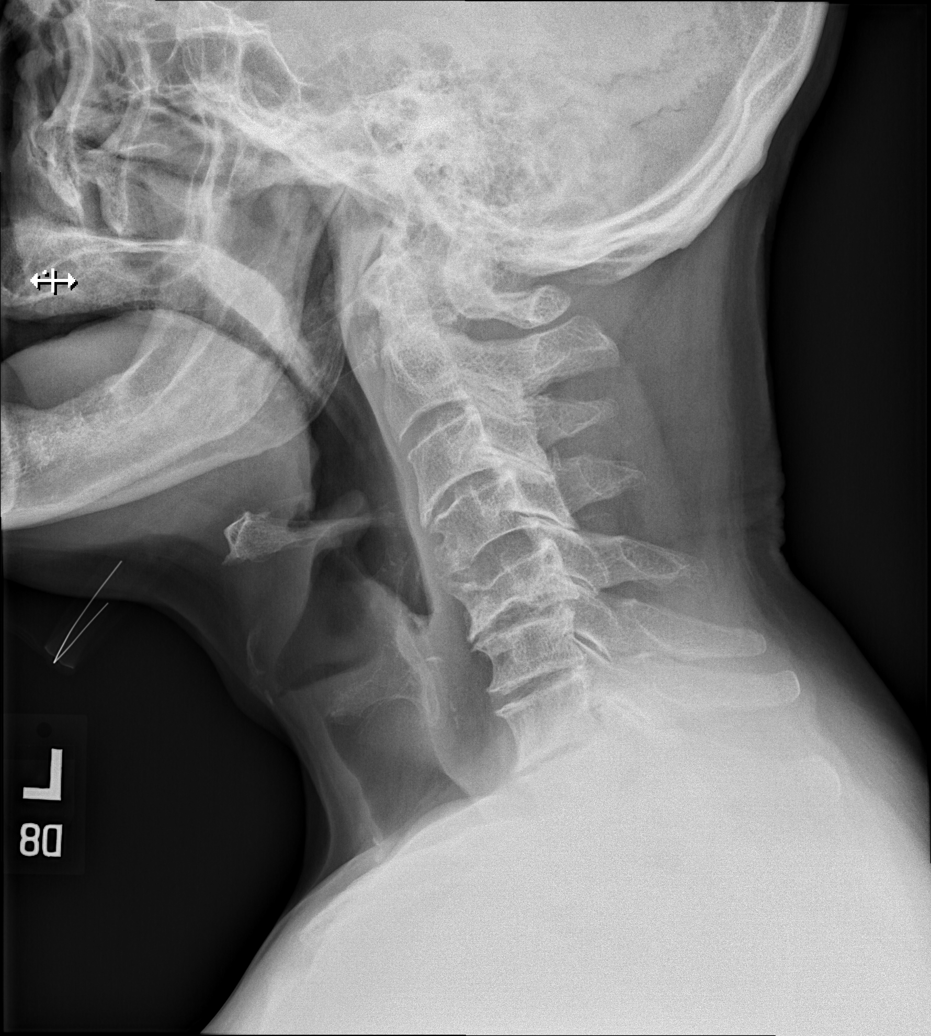

[w cervical spine ap_obl (1 of 2)]
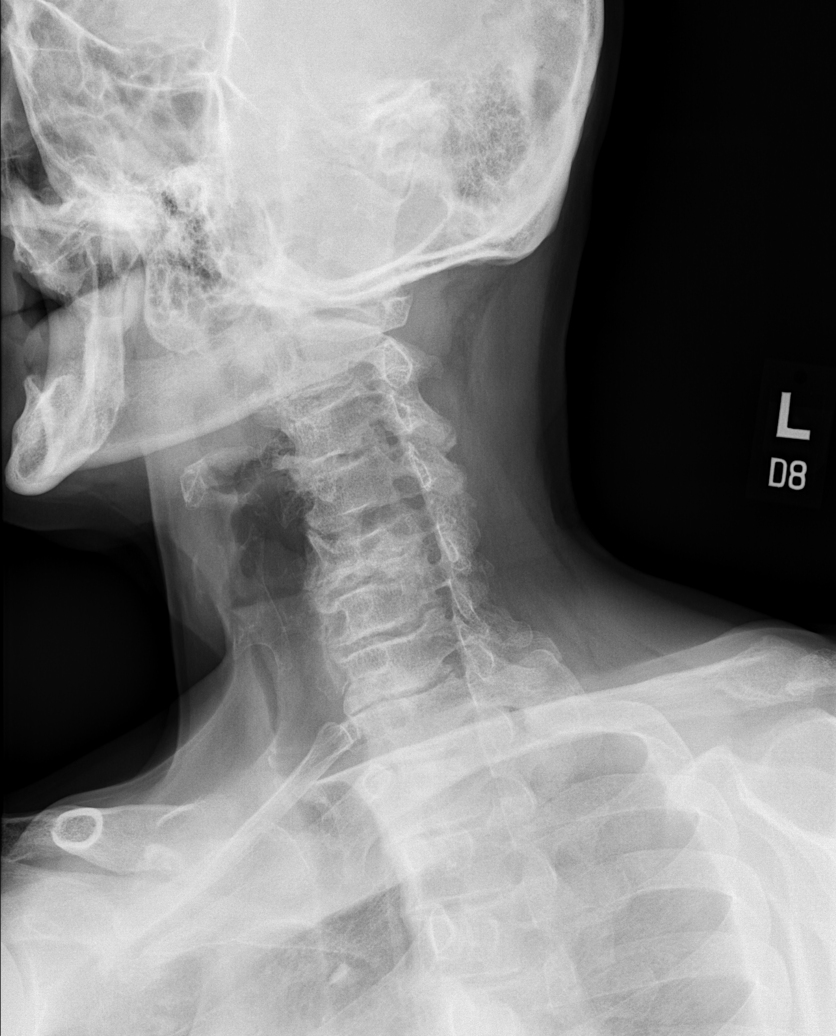

[w cervical spine ap_obl (2 of 2)]
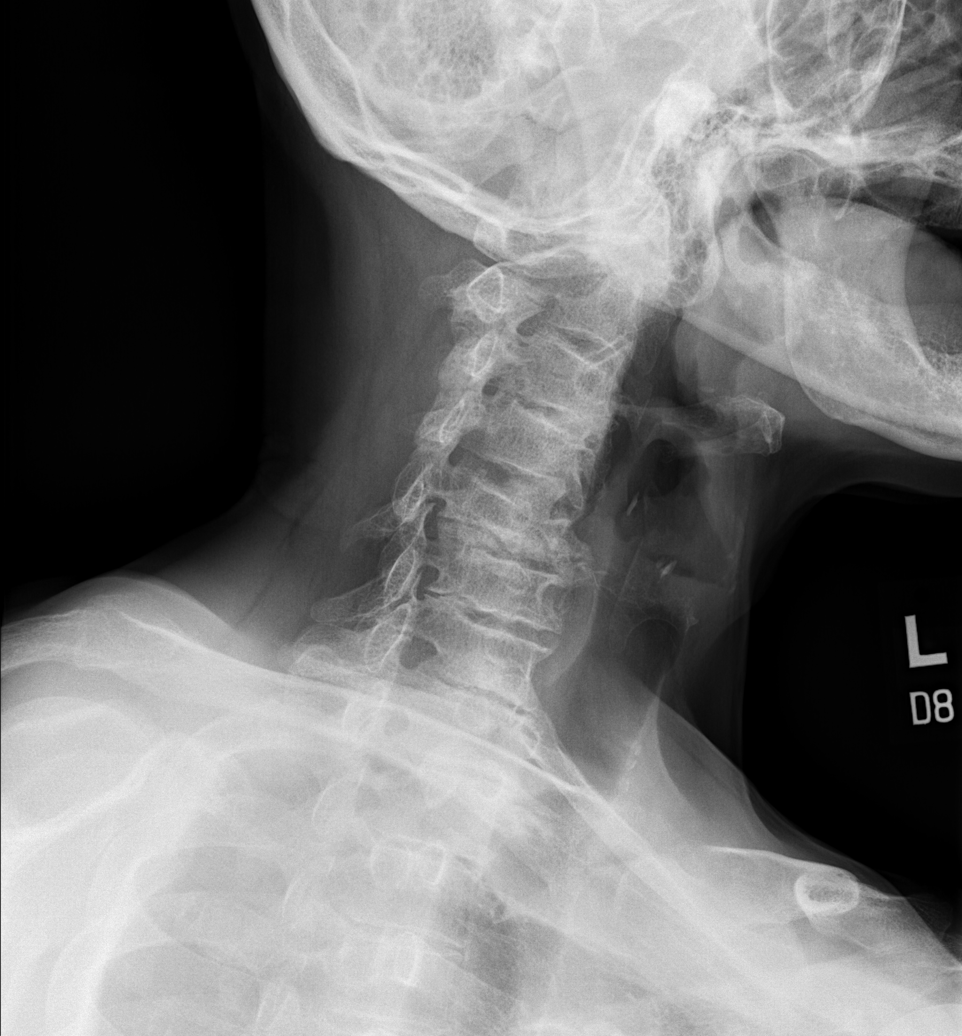

[w cervical spine ap]
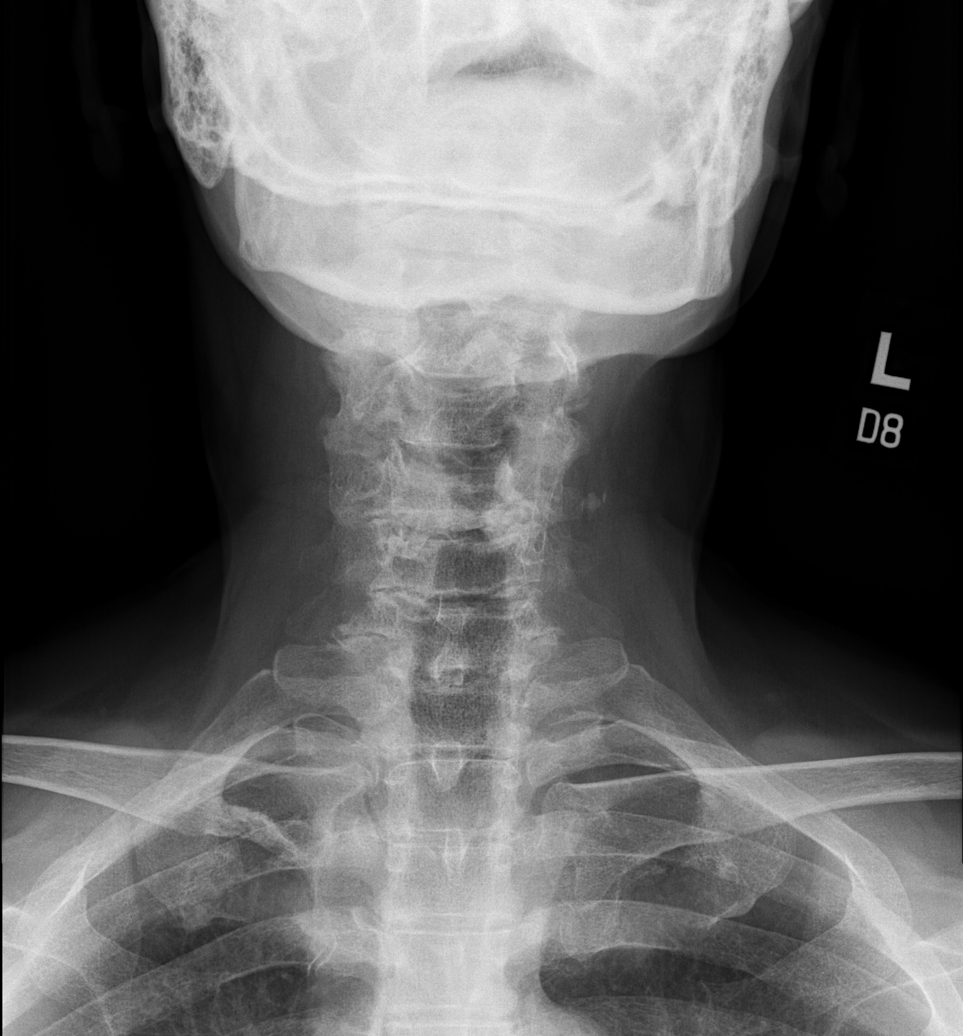

[w cervical spine odontoid]
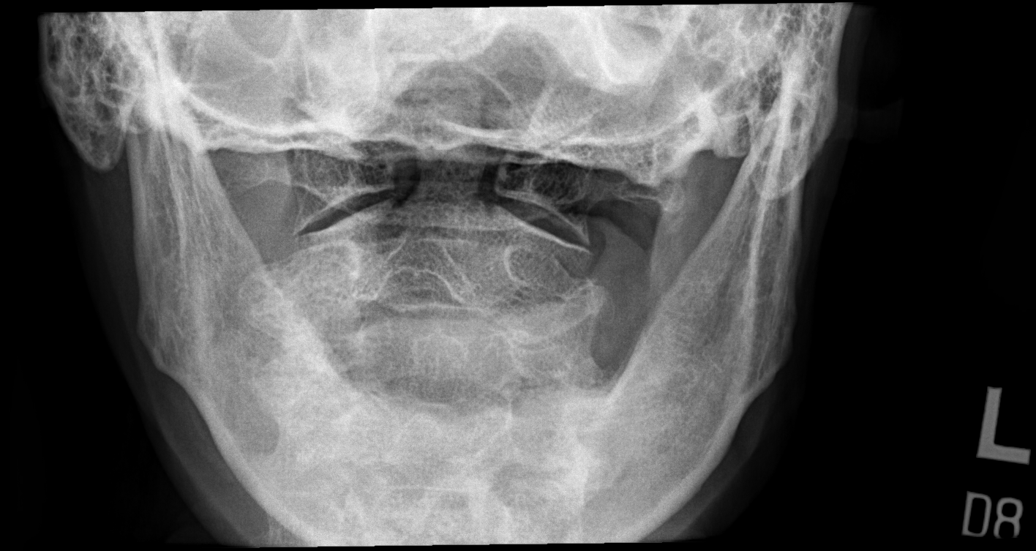

[w cervical swimmers]
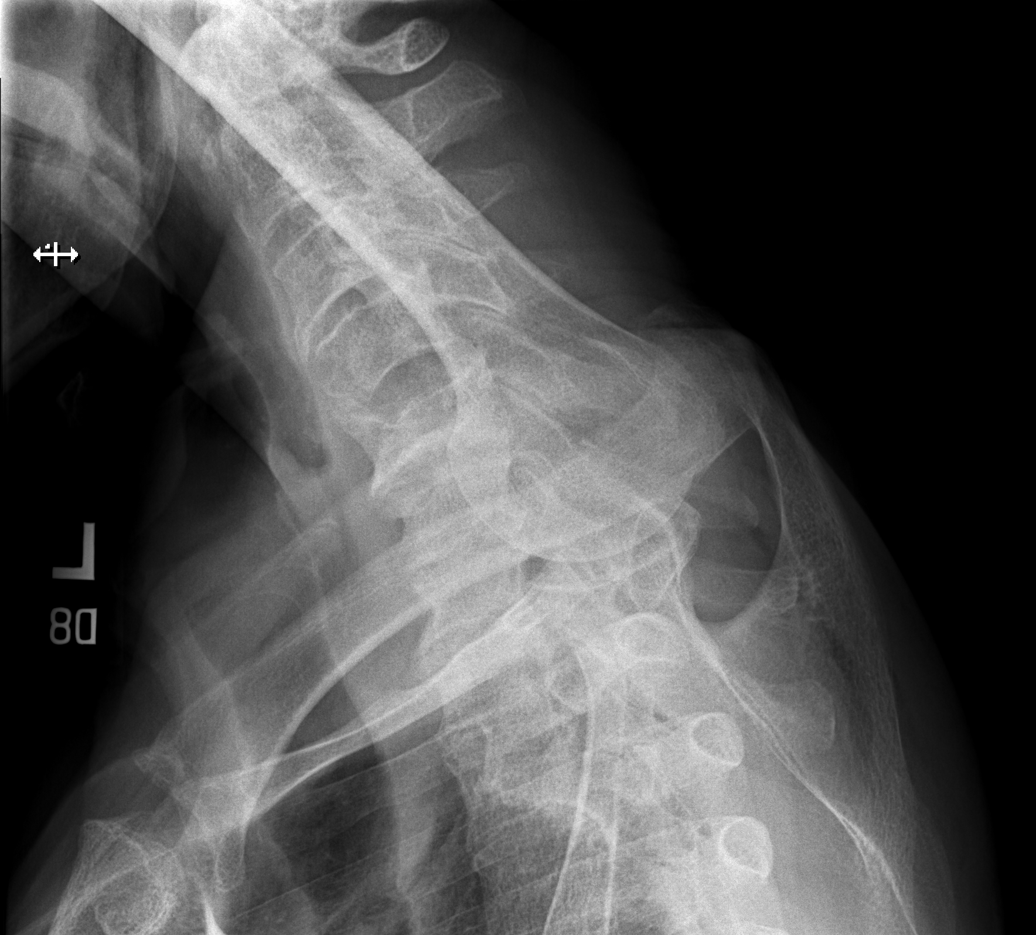

[6 of 6 positions shown; findings below may reference images not displayed]

FINDINGS: Reversal of the expected cervical lordosis. C5-C6 grade 1
retrolisthesis

There is mild age-indeterminate C5 vertebral body height loss.

Moderate/advanced disc space narrowing at the C3-C4 through C7-T1
levels. Multilevel uncovertebral hypertrophy and facet arthrosis.
Multilevel bony neural foraminal narrowing greatest on the right at
C3-C4, C5-C6 and C6-C7 and on the left at C3-C4 C5-C6 and C6-C7.

Unremarkable appearance of the C1-C2 articulation on the dedicated
odontoid view.
IMPRESSION: Age-indeterminate mild C5 vertebral body height loss.

Reversal of the expected cervical lordosis. Mild C5-C6 grade 1
retrolisthesis.

Cervical spondylosis as outlined.

## 2020-10-11 ENCOUNTER — Emergency Department (HOSPITAL_COMMUNITY): Payer: Medicare HMO

## 2020-10-11 ENCOUNTER — Encounter (HOSPITAL_COMMUNITY): Payer: Self-pay | Admitting: Emergency Medicine

## 2020-10-11 ENCOUNTER — Emergency Department (HOSPITAL_COMMUNITY)
Admission: EM | Admit: 2020-10-11 | Discharge: 2020-10-11 | Disposition: A | Discharge status: Home / Self Care | Payer: Medicare HMO | Attending: Emergency Medicine | Admitting: Emergency Medicine

## 2020-10-11 ENCOUNTER — Other Ambulatory Visit: Payer: Self-pay

## 2020-10-11 DIAGNOSIS — M25511 Pain in right shoulder: Secondary | ICD-10-CM | POA: Diagnosis not present

## 2020-10-11 DIAGNOSIS — M542 Cervicalgia: Secondary | ICD-10-CM | POA: Diagnosis present

## 2020-10-11 DIAGNOSIS — M25512 Pain in left shoulder: Secondary | ICD-10-CM | POA: Diagnosis not present

## 2020-10-11 DIAGNOSIS — Z79899 Other long term (current) drug therapy: Secondary | ICD-10-CM | POA: Diagnosis not present

## 2020-10-11 DIAGNOSIS — I10 Essential (primary) hypertension: Secondary | ICD-10-CM | POA: Insufficient documentation

## 2020-10-11 DIAGNOSIS — F17210 Nicotine dependence, cigarettes, uncomplicated: Secondary | ICD-10-CM | POA: Diagnosis not present

## 2020-10-11 MED ORDER — LIDOCAINE 5 % EX PTCH: 1.0000 | MEDICATED_PATCH | CUTANEOUS | 0 refills | Status: DC

## 2020-10-11 MED ORDER — ONDANSETRON 4 MG PO TBDP
4.0000 mg | ORAL_TABLET | Freq: Once | ORAL | Status: AC
  Administered 2020-10-11: 4 mg via ORAL
  Filled 2020-10-11: qty 1

## 2020-10-11 MED ORDER — OXYCODONE HCL 5 MG PO TABS: 5.0000 mg | ORAL_TABLET | Freq: Four times a day (QID) | ORAL | 0 refills | Status: DC | PRN

## 2020-10-11 MED ORDER — OXYCODONE HCL 5 MG PO TABS
5.0000 mg | ORAL_TABLET | Freq: Once | ORAL | Status: AC
  Administered 2020-10-11: 5 mg via ORAL
  Filled 2020-10-11: qty 1

## 2020-10-11 NOTE — Discharge Instructions (Signed)
I recommend close follow-up with your primary care physician and orthopedist.  Please continue your Mobic daily as prescribed.  You may alternate heat and ice to your neck and shoulders to help with pain.  You are being provided a prescription for opiates (also known as narcotics) for pain control.  Opiates can be addictive and should only be used when absolutely necessary for pain control when other alternatives do not work.  We recommend you only use them for the recommended amount of time and only as prescribed.  Please do not take with other sedative medications or alcohol.  Please do not drive, operate machinery, make important decisions while taking opiates.  Please note that these medications can be addictive and have high abuse potential.  Patients can become addicted to narcotics after only taking them for a few days.  Please keep these medications locked away from children, teenagers or any family members with history of substance abuse.  Narcotic pain medicine may also make you constipated.  You may use over-the-counter medications such as MiraLAX, Colace to prevent constipation.  If you become constipated you may use over-the-counter enemas as needed.  Itching and nausea are common side effects of narcotic pain medication.  If you develop uncontrolled vomiting or a rash, please stop these medications.

## 2020-10-11 NOTE — ED Provider Notes (Signed)
TIME SEEN: 12:40 AM  CHIEF COMPLAINT: Neck pain, bilateral shoulder pain  HPI: Patient is a 69 year old male with history of hypertension, hepatitis C who presents to the emergency department with 1 month of neck pain, bilateral shoulder pain with pain that radiates down both arms.  No numbness, tingling or focal weakness.  No bowel or bladder incontinence.  He is able to ambulate without difficulty.  No injury that he can recall.  States he was seen by his PCP and was told this with arthritis.  He has not had any outpatient imaging.  He denies fever.  No history of epidural injections, back surgery, cancer, IV drug abuse.  He has not a diabetic.  States he has been taking Tylenol without any relief.  States he was told by his doctor he could not take ibuprofen due to his "liver disease".  ROS: See HPI Constitutional: no fever  Eyes: no drainage  ENT: no runny nose   Cardiovascular:  no chest pain  Resp: no SOB  GI: no vomiting GU: no dysuria Integumentary: no rash  Allergy: no hives  Musculoskeletal: no leg swelling  Neurological: no slurred speech ROS otherwise negative  PAST MEDICAL HISTORY/PAST SURGICAL HISTORY:  Past Medical History:  Diagnosis Date  . Hepatitis    Hep C  . Hypertension     MEDICATIONS:  Prior to Admission medications   Medication Sig Start Date End Date Taking? Authorizing Provider  Cyanocobalamin (VITAMIN B 12) 500 MCG TABS Take 500 mg by mouth 3 (three) times a week.     [provider]  levothyroxine (SYNTHROID) 50 MCG tablet Take 50 mcg by mouth daily.  12/23/19   [provider]  lisinopril (ZESTRIL) 10 MG tablet Take 10 mg by mouth daily. 12/23/19   [provider]  methocarbamol (ROBAXIN) 500 MG tablet Take 1 tablet (500 mg total) by mouth 2 (two) times daily. 05/20/20   Tedd Sias, PA  nadolol (CORGARD) 20 MG tablet Take 1 tablet (20 mg total) by mouth daily. 07/16/20 07/16/21  Mauri Pole, MD    ALLERGIES:  No Known  Allergies  SOCIAL HISTORY:  Social History   Tobacco Use  . Smoking status: Current Every Day Smoker    Packs/day: 0.50    Years: 45.00    Pack years: 22.50    Types: Cigarettes  . Smokeless tobacco: Never Used  Substance Use Topics  . Alcohol use: Not Currently    Comment: quit 09/2019, previous 40 year history 12 drinks daily     FAMILY HISTORY: Family History  Problem Relation Age of Onset  . Diabetes Mother   . Heart disease Mother   . Heart disease Father   . CAD Brother   . Colon cancer Neg Hx   . Esophageal cancer Neg Hx   . Pancreatic cancer Neg Hx   . Stomach cancer Neg Hx     EXAM: BP (!) 177/99 (BP Location: Right Arm)   Pulse 88   Temp 97.9 F (36.6 C) (Oral)   Resp 18   SpO2 99%  CONSTITUTIONAL: Alert and oriented and responds appropriately to questions. Well-appearing; well-nourished, in no distress HEAD: Normocephalic EYES: Conjunctivae clear, pupils appear equal, EOM appear intact ENT: normal nose; moist mucous membranes NECK: Supple, normal ROM, patient does have some midline cervical spine tenderness without step-off or deformity, no redness or warmth, no soft tissue swelling or ecchymosis, patient is also tender to palpation with associated muscle tightness over bilateral trapezius muscles worse  on the right than the left CARD: RRR; S1 and S2 appreciated; no murmurs, no clicks, no rubs, no gallops RESP: Normal chest excursion without splinting or tachypnea; breath sounds clear and equal bilaterally; no wheezes, no rhonchi, no rales, no hypoxia or respiratory distress, speaking full sentences ABD/GI: Normal bowel sounds; non-distended; soft, non-tender, no rebound, no guarding, no peritoneal signs, no hepatosplenomegaly BACK:  The back appears normal, no midline spinal tenderness or step-off or deformity EXT: Normal ROM in all joints; no deformity noted, no edema; no cyanosis, no pain with palpation over his bilateral upper extremities but he does have  pain with lifting the shoulders past 90 degrees especially on the right side.  He has some pain with resisted abduction and adduction but no significant loss in strength.  Normal grip strength bilaterally.  Compartments soft.  Normal capillary refill. SKIN: Normal color for age and race; warm; no rash on exposed skin NEURO: Moves all extremities equally, reports normal sensation diffusely, normal gait, no clonus, no saddle anesthesia PSYCH: The patient's mood and manner are appropriate.   MEDICAL DECISION MAKING: Patient here with neck and shoulder pain.  Will obtain CT imaging to ensure no occult fracture or other lesions given patient's age.  Symptoms may be secondary to radiculopathy but also suspect that there is a component of muscle strain, spasm as well as rotator cuff pathology.  He states his daughter drove him here to the emergency department.  Will give dose of oxycodone here and reassess.  No red flag symptoms to suggest severe spinal stenosis, cervical myelopathy, epidural abscess or hematoma, discitis or osteomyelitis, transverse myelitis.  I do not think this is his anginal equivalent.  Pain is reproducible with palpation, movement.  ED PROGRESS: Patient reports feeling better.  He states that he does take Mobic daily.  Have advised him to continue this medication.  Will discharge with Lidoderm patches and brief course of oxycodone.  Have recommended close PCP follow-up.  His daughter reports he has an orthopedic physician for follow-up as well.  CT scan shows degenerative changes and reversal of the cervical lordosis consistent with muscle spasm.  Doubt ligamentous injury given he does not report any injury.  He has a compression fracture of C5 which is unchanged.  Discussed these findings with patient and family at bedside.  He is comfortable with plan for outpatient follow-up.  At this time, I do not feel there is any life-threatening condition present. I have reviewed, interpreted and  discussed all results (EKG, imaging, lab, urine as appropriate) and exam findings with patient/family. I have reviewed nursing notes and appropriate previous records.  I feel the patient is safe to be discharged home without further emergent workup and can continue workup as an outpatient as needed. Discussed usual and customary return precautions. Patient/family verbalize understanding and are comfortable with this plan.  Outpatient follow-up has been provided as needed. All questions have been answered.   Fernando Ryan was evaluated in Emergency Department on 10/11/2020 for the symptoms described in the history of present illness. He was evaluated in the context of the global COVID-19 pandemic, which necessitated consideration that the patient might be at risk for infection with the SARS-CoV-2 virus that causes COVID-19. Institutional protocols and algorithms that pertain to the evaluation of patients at risk for COVID-19 are in a state of rapid change based on information released by regulatory bodies including the CDC and federal and state organizations. These policies and algorithms were followed during the patient's care in  the ED.      Lalena Salas, Delice Bison, DO 10/11/20 [phone]

## 2020-10-11 NOTE — ED Triage Notes (Signed)
Patient complaining of bilateral arm and neck pain. Patient states his doctors state it is arthritis, but he doesn't believe them. Patient with full ROM, PMS in tact.

## 2020-10-14 ENCOUNTER — Other Ambulatory Visit: Payer: Self-pay | Admitting: Interventional Radiology

## 2020-10-14 DIAGNOSIS — C220 Liver cell carcinoma: Secondary | ICD-10-CM

## 2020-12-21 ENCOUNTER — Encounter (HOSPITAL_COMMUNITY): Payer: Self-pay

## 2020-12-21 ENCOUNTER — Ambulatory Visit (HOSPITAL_COMMUNITY): Admission: RE | Admit: 2020-12-21 | Payer: Medicare HMO | Source: Ambulatory Visit

## 2020-12-23 ENCOUNTER — Telehealth: Payer: Medicare HMO

## 2021-02-02 ENCOUNTER — Other Ambulatory Visit: Payer: Self-pay

## 2021-02-02 ENCOUNTER — Emergency Department (HOSPITAL_COMMUNITY): Payer: Medicare HMO

## 2021-02-02 ENCOUNTER — Encounter (HOSPITAL_COMMUNITY): Payer: Self-pay

## 2021-02-02 ENCOUNTER — Emergency Department (HOSPITAL_COMMUNITY)
Admission: EM | Admit: 2021-02-02 | Discharge: 2021-02-03 | Disposition: A | Discharge status: Home / Self Care | Payer: Medicare HMO | Attending: Emergency Medicine | Admitting: Emergency Medicine

## 2021-02-02 DIAGNOSIS — Z8505 Personal history of malignant neoplasm of liver: Secondary | ICD-10-CM | POA: Diagnosis not present

## 2021-02-02 DIAGNOSIS — R1011 Right upper quadrant pain: Secondary | ICD-10-CM | POA: Diagnosis not present

## 2021-02-02 DIAGNOSIS — R634 Abnormal weight loss: Secondary | ICD-10-CM | POA: Diagnosis not present

## 2021-02-02 DIAGNOSIS — R630 Anorexia: Secondary | ICD-10-CM | POA: Diagnosis not present

## 2021-02-02 DIAGNOSIS — Z79899 Other long term (current) drug therapy: Secondary | ICD-10-CM | POA: Diagnosis not present

## 2021-02-02 DIAGNOSIS — R1013 Epigastric pain: Secondary | ICD-10-CM | POA: Insufficient documentation

## 2021-02-02 DIAGNOSIS — I10 Essential (primary) hypertension: Secondary | ICD-10-CM | POA: Insufficient documentation

## 2021-02-02 DIAGNOSIS — F17210 Nicotine dependence, cigarettes, uncomplicated: Secondary | ICD-10-CM | POA: Insufficient documentation

## 2021-02-02 LAB — COMPREHENSIVE METABOLIC PANEL
ALT: 16 U/L (ref 0–44)
AST: 26 U/L (ref 15–41)
Albumin: 3.3 g/dL — ABNORMAL LOW (ref 3.5–5.0)
Alkaline Phosphatase: 65 U/L (ref 38–126)
Anion gap: 8 (ref 5–15)
BUN: 18 mg/dL (ref 8–23)
CO2: 25 mmol/L (ref 22–32)
Calcium: 9.5 mg/dL (ref 8.9–10.3)
Chloride: 104 mmol/L (ref 98–111)
Creatinine, Ser: 1.37 mg/dL — ABNORMAL HIGH (ref 0.61–1.24)
GFR, Estimated: 55 mL/min — ABNORMAL LOW (ref >60)
Glucose, Bld: 88 mg/dL (ref 70–99)
Potassium: 4.5 mmol/L (ref 3.5–5.1)
Sodium: 137 mmol/L (ref 135–145)
Total Bilirubin: 0.4 mg/dL (ref 0.3–1.2)
Total Protein: 7.4 g/dL (ref 6.5–8.1)

## 2021-02-02 LAB — CBC
HCT: 32.9 % — ABNORMAL LOW (ref 39.0–52.0)
Hemoglobin: 11.2 g/dL — ABNORMAL LOW (ref 13.0–17.0)
MCH: 33.5 pg (ref 26.0–34.0)
MCHC: 34 g/dL (ref 30.0–36.0)
MCV: 98.5 fL (ref 80.0–100.0)
Platelets: 96 10*3/uL — ABNORMAL LOW (ref 150–400)
RBC: 3.34 MIL/uL — ABNORMAL LOW (ref 4.22–5.81)
RDW: 13 % (ref 11.5–15.5)
WBC: 5.5 10*3/uL (ref 4.0–10.5)
nRBC: 0 % (ref 0.0–0.2)

## 2021-02-02 LAB — LIPASE, BLOOD: Lipase: 75 U/L — ABNORMAL HIGH (ref 11–51)

## 2021-02-02 MED ORDER — IOHEXOL 300 MG/ML  SOLN
100.0000 mL | Freq: Once | INTRAMUSCULAR | Status: AC | PRN
  Administered 2021-02-02: 100 mL via INTRAVENOUS

## 2021-02-02 MED ORDER — FENTANYL CITRATE (PF) 100 MCG/2ML IJ SOLN
50.0000 ug | Freq: Once | INTRAMUSCULAR | Status: AC
  Administered 2021-02-02: 50 ug via INTRAVENOUS
  Filled 2021-02-02: qty 2

## 2021-02-02 MED ORDER — SODIUM CHLORIDE 0.9 % IV BOLUS
1000.0000 mL | Freq: Once | INTRAVENOUS | Status: AC
  Administered 2021-02-02: 1000 mL via INTRAVENOUS

## 2021-02-02 NOTE — ED Provider Notes (Signed)
[facility] Provider Note  CSN: [id number] Arrival date & time: 02/02/21 4742    History Chief Complaint  Patient presents with  . Abdominal Pain    HPI  Fernando Ryan is a 70 y.o. male with history of hepatitis and hepatocellular carcinoma presents with several days of epigastric and RUQ abdominal pain, worse after eating. He has had some anorexia in the last few weeks leading up to the pain associated with weight loss. He was seen at his PCP office yesterday for same. He was called today and told his amylase was low and she was concerned about pancreatitis. Those results are not available to me.  He denies any fevers, but states his abdomen has felt hot. No nausea or vomiting. Normal BMs without blood or melena. No dysuria or hematuria, no tea colored urine.    Past Medical History:  Diagnosis Date  . Hepatitis    Hep C  . Hypertension     Past Surgical History:  Procedure Laterality Date  . COLONOSCOPY WITH PROPOFOL N/A 01/07/2020   Procedure: COLONOSCOPY WITH PROPOFOL;  Surgeon: Mauri Pole, MD;  Location: [facility];  Service: Endoscopy;  Laterality: N/A;  . ESOPHAGOGASTRODUODENOSCOPY (EGD) WITH PROPOFOL N/A 01/07/2020   Procedure: ESOPHAGOGASTRODUODENOSCOPY (EGD) WITH PROPOFOL;  Surgeon: Mauri Pole, MD;  Location: [facility];  Service: Endoscopy;  Laterality: N/A;  . FEMUR FRACTURE SURGERY    . HEMOSTASIS CLIP PLACEMENT  01/07/2020   Procedure: HEMOSTASIS CLIP PLACEMENT;  Surgeon: Mauri Pole, MD;  Location: [facility];  Service: Endoscopy;;  . IR RADIOLOGIST EVAL & MGMT  01/28/2020  . IR RADIOLOGIST EVAL & MGMT  04/09/2020  . IR RADIOLOGIST EVAL & MGMT  08/26/2020  . POLYPECTOMY  01/07/2020   Procedure: POLYPECTOMY;  Surgeon: Mauri Pole, MD;  Location: [facility];  Service: Endoscopy;;  . RADIOLOGY WITH ANESTHESIA N/A 03/04/2020   Procedure: CT WITH ANESTHESIA  MICROWAVE ABLATION;  Surgeon: Jacqulynn Cadet,  MD;  Location: [facility];  Service: Anesthesiology;  Laterality: N/A;    Family History  Problem Relation Age of Onset  . Diabetes Mother   . Heart disease Mother   . Heart disease Father   . CAD Brother   . Colon cancer Neg Hx   . Esophageal cancer Neg Hx   . Pancreatic cancer Neg Hx   . Stomach cancer Neg Hx     Social History   Tobacco Use  . Smoking status: Current Every Day Smoker    Packs/day: 0.25    Years: 45.00    Pack years: 11.25    Types: Cigarettes  . Smokeless tobacco: Never Used  Vaping Use  . Vaping Use: Never used  Substance Use Topics  . Alcohol use: Not Currently    Comment: quit 09/2019, previous 40 year history 12 drinks daily   . Drug use: Not Currently     Home Medications Prior to Admission medications   Medication Sig Start Date End Date Taking? Authorizing Provider  Cyanocobalamin (VITAMIN B 12) 500 MCG TABS Take 500 mg by mouth 3 (three) times a week.     [provider]  levothyroxine (SYNTHROID) 50 MCG tablet Take 50 mcg by mouth daily.  12/23/19   [provider]  lidocaine (LIDODERM) 5 % Place 1 patch onto the skin daily. Remove & Discard patch within 12 hours or as directed by MD 10/11/20   Ward, Delice Bison, DO  lisinopril (ZESTRIL) 10 MG tablet Take 10 mg by mouth  daily. 12/23/19   [provider]  methocarbamol (ROBAXIN) 500 MG tablet Take 1 tablet (500 mg total) by mouth 2 (two) times daily. 05/20/20   Tedd Sias, PA  nadolol (CORGARD) 20 MG tablet Take 1 tablet (20 mg total) by mouth daily. 07/16/20 07/16/21  Mauri Pole, MD  oxyCODONE (ROXICODONE) 5 MG immediate release tablet Take 1 tablet (5 mg total) by mouth every 6 (six) hours as needed for severe pain. 10/11/20   Ward, Delice Bison, DO     Allergies    Patient has no known allergies.   Review of Systems   Review of Systems A comprehensive review of systems was completed and negative except as noted in HPI.    Physical Exam BP 113/64   Pulse 62    Temp 98.9 F (37.2 C) (Oral)   Resp 16   Ht 5\' 6"  (1.676 m)   Wt 66.2 kg   SpO2 100%   BMI 23.57 kg/m   Physical Exam Vitals and nursing note reviewed.  Constitutional:      Appearance: Normal appearance.  HENT:     Head: Normocephalic and atraumatic.     Nose: Nose normal.     Mouth/Throat:     Mouth: Mucous membranes are moist.  Eyes:     Extraocular Movements: Extraocular movements intact.     Conjunctiva/sclera: Conjunctivae normal.  Cardiovascular:     Rate and Rhythm: Normal rate.  Pulmonary:     Effort: Pulmonary effort is normal.     Breath sounds: Normal breath sounds.  Abdominal:     General: Abdomen is flat.     Palpations: Abdomen is soft.     Tenderness: There is abdominal tenderness in the right upper quadrant and epigastric area. There is guarding. Positive signs include Murphy's sign. Negative signs include McBurney's sign.  Musculoskeletal:        General: No swelling. Normal range of motion.     Cervical back: Neck supple.  Skin:    General: Skin is warm and dry.  Neurological:     General: No focal deficit present.     Mental Status: He is alert.  Psychiatric:        Mood and Affect: Mood normal.      ED Results / Procedures / Treatments   Labs (all labs ordered are listed, but only abnormal results are displayed) Labs Reviewed  LIPASE, BLOOD - Abnormal; Notable for the following components:      Result Value   Lipase 75 (*)    All other components within normal limits  COMPREHENSIVE METABOLIC PANEL - Abnormal; Notable for the following components:   Creatinine, Ser 1.37 (*)    Albumin 3.3 (*)    GFR, Estimated 55 (*)    All other components within normal limits  CBC - Abnormal; Notable for the following components:   RBC 3.34 (*)    Hemoglobin 11.2 (*)    HCT 32.9 (*)    Platelets 96 (*)    All other components within normal limits  URINALYSIS, ROUTINE W REFLEX MICROSCOPIC    EKG None  Radiology US Abdomen Limited  Result  Date: 02/02/2021 CLINICAL DATA:  Right upper quadrant pain EXAM: ULTRASOUND ABDOMEN LIMITED RIGHT UPPER QUADRANT COMPARISON:  None. FINDINGS: Gallbladder: Layering gallstones are present. No sonographic Percell Miller sign is noted. There is a small amount of pericholecystic fluid. Gallbladder wall thickness is mildly prominent measuring up to 3 mm. Common bile duct: Diameter: 3.3 mm Liver: Coarsened heterogeneous  liver parenchyma is noted. Portal vein is patent on color Doppler imaging with normal direction of blood flow towards the liver. Other: None. IMPRESSION: Cholelithiasis with a small amount of pericholecystic fluid and mildly prominent gallbladder wall. This is non-specific however could be due to physiologic under distension versus mild acute cholecystitis. Findings suggestive of cirrhosis Electronically Signed   By: Prudencio Pair M.D.   On: 02/02/2021 22:32    Procedures Procedures  Medications Ordered in the ED Medications  sodium chloride 0.9 % bolus 1,000 mL (1,000 mLs Intravenous New Bag/Given 02/02/21 2210)  fentaNYL (SUBLIMAZE) injection 50 mcg (50 mcg Intravenous Given 02/02/21 2210)     MDM Rules/Calculators/A&P MDM Patient with epigastric and RUQ pain/tenderness. Labs here show mildly elevated lipase but not three times the upper limit of normal to suggest acute pancreatitis as the cause. Low amylase is not an indicator of acute pancreatitis, it could be an indication of chronic pancreatitis but he has never had this before so I'm not quite sure where his PCP came to that conclusion. Regardless, his exam today is more concerning for biliary disease. His LFTs are unremarkable, bili is normal. Will send for [place] to eval stones vs cholecystitis. If nondiagnostic may need CT to further evaluate.  ED Course  I have reviewed the triage vital signs and the nursing notes.  Pertinent labs & imaging results that were available during my care of the patient were reviewed by me and considered in my  medical decision making (see chart for details).  Clinical Course as of 02/02/21 2308  Tue Feb 02, 2021  2237 [place] is equivocal for cholecystitis. Will check CT to rule out other causes. May need admission for HIDA if also inconclusive.  [CS]  2307 Care of the patient signed out to Dr. Tyrone Nine at the change of shift pending CT.  [CS]    Clinical Course User Index [CS] Truddie Hidden, MD    Final Clinical Impression(s) / ED Diagnoses Final diagnoses:  None    Rx / DC Orders ED Discharge Orders    None       Truddie Hidden, MD 02/02/21 2308

## 2021-02-02 NOTE — ED Triage Notes (Signed)
Patient c/o abdominal pain x 2 weeks. Patient states he went to see his PCp yesterday and was called today and told he had pancreatitis.

## 2021-02-02 NOTE — ED Provider Notes (Signed)
I received the patient in signout from Dr. Karle Starch briefly the patient is a 70 year old male with epigastric and right upper quadrant abdominal pain.  Told by his PCP that he likely had pancreatitis and was sent to the ED.  Lipase is mildly elevated here.  Ultrasound equivocal for gallbladder pathology.  Plan for CT scan likely admission.  CT scan has returned without significant pathology.  The patient is currently feeling well though he thinks it secondary to the pain medicine that was given him to him previously.  His biggest complaint is that he is not really been able to eat for the past few weeks.  Was given a diet here and was able to tolerated.  Discussed risks and benefits of coming into the hospital for possible HIDA scan versus outpatient follow-up with general surgery.  Patient at this time is electing for outpatient follow-up.  The patient's UA is somewhat concerning for a urinary tract infection though the patient had no urinary symptoms and was complaining mostly of epigastric pain.  Hold off on antibiotics at this time.       Deno Etienne, DO 02/03/21 [phone]

## 2021-02-02 NOTE — ED Notes (Signed)
US at bedside

## 2021-02-03 LAB — URINALYSIS, ROUTINE W REFLEX MICROSCOPIC
Bilirubin Urine: NEGATIVE
Glucose, UA: NEGATIVE mg/dL
Hgb urine dipstick: NEGATIVE
Ketones, ur: NEGATIVE mg/dL
Nitrite: POSITIVE — AB
Protein, ur: NEGATIVE mg/dL
Specific Gravity, Urine: 1.015 (ref 1.005–1.030)
WBC, UA: >50 WBC/hpf — ABNORMAL HIGH (ref 0–5)
pH: 5 (ref 5.0–8.0)

## 2021-02-03 MED ORDER — ONDANSETRON 4 MG PO TBDP: ORAL_TABLET | ORAL | 0 refills | Status: AC

## 2021-02-03 MED ORDER — ALUM & MAG HYDROXIDE-SIMETH 200-200-20 MG/5ML PO SUSP
30.0000 mL | Freq: Once | ORAL | Status: AC
  Administered 2021-02-03: 30 mL via ORAL
  Filled 2021-02-03: qty 30

## 2021-02-03 MED ORDER — ONDANSETRON 4 MG PO TBDP
4.0000 mg | ORAL_TABLET | Freq: Once | ORAL | Status: AC
  Administered 2021-02-03: 4 mg via ORAL
  Filled 2021-02-03: qty 1

## 2021-02-03 NOTE — ED Notes (Signed)
Pt given water, crackers and sandwich.

## 2021-02-03 NOTE — Discharge Instructions (Addendum)
Try pepcid or tagamet up to twice a day.  Try to avoid things that may make this worse, most commonly these are spicy foods tomato based products fatty foods chocolate and peppermint.  Alcohol and tobacco can also make this worse.  Return to the emergency department for sudden worsening pain fever or inability to eat or drink.  Take your home pain medicine as needed.  Follow-up with the general surgeon in the office.  Try to eat a bland diet.

## 2021-02-15 DIAGNOSIS — C220 Liver cell carcinoma: Secondary | ICD-10-CM | POA: Diagnosis not present

## 2021-02-15 DIAGNOSIS — M25511 Pain in right shoulder: Secondary | ICD-10-CM | POA: Diagnosis not present

## 2021-02-15 DIAGNOSIS — F17210 Nicotine dependence, cigarettes, uncomplicated: Secondary | ICD-10-CM | POA: Diagnosis not present

## 2021-02-15 DIAGNOSIS — M5030 Other cervical disc degeneration, unspecified cervical region: Secondary | ICD-10-CM | POA: Diagnosis not present

## 2021-02-15 DIAGNOSIS — Z9189 Other specified personal risk factors, not elsewhere classified: Secondary | ICD-10-CM | POA: Diagnosis not present

## 2021-02-15 DIAGNOSIS — Z79899 Other long term (current) drug therapy: Secondary | ICD-10-CM | POA: Diagnosis not present

## 2021-02-15 DIAGNOSIS — K7469 Other cirrhosis of liver: Secondary | ICD-10-CM | POA: Diagnosis not present

## 2021-02-15 DIAGNOSIS — I8510 Secondary esophageal varices without bleeding: Secondary | ICD-10-CM | POA: Diagnosis not present

## 2021-02-17 DIAGNOSIS — K7469 Other cirrhosis of liver: Secondary | ICD-10-CM | POA: Diagnosis not present

## 2021-02-17 DIAGNOSIS — C220 Liver cell carcinoma: Secondary | ICD-10-CM | POA: Diagnosis not present

## 2021-02-17 DIAGNOSIS — K8020 Calculus of gallbladder without cholecystitis without obstruction: Secondary | ICD-10-CM | POA: Diagnosis not present

## 2021-03-08 ENCOUNTER — Other Ambulatory Visit: Payer: Medicare HMO

## 2021-04-05 ENCOUNTER — Ambulatory Visit (HOSPITAL_COMMUNITY)
Admission: RE | Admit: 2021-04-05 | Discharge: 2021-04-05 | Disposition: A | Discharge status: Home / Self Care | Payer: Medicare HMO | Source: Ambulatory Visit | Attending: Interventional Radiology | Admitting: Interventional Radiology

## 2021-04-05 ENCOUNTER — Other Ambulatory Visit: Payer: Self-pay

## 2021-04-05 DIAGNOSIS — C220 Liver cell carcinoma: Secondary | ICD-10-CM | POA: Insufficient documentation

## 2021-04-05 MED ORDER — GADOXETATE DISODIUM 0.25 MMOL/ML IV SOLN
8.0000 mL | Freq: Once | INTRAVENOUS | Status: AC | PRN
  Administered 2021-04-05: 8 mL via INTRAVENOUS

## 2021-04-22 ENCOUNTER — Telehealth: Payer: Self-pay | Admitting: Hematology

## 2021-04-22 NOTE — Telephone Encounter (Signed)
Scheduled appointment per 06/09 sch msg. Patient is aware. 

## 2021-04-23 ENCOUNTER — Other Ambulatory Visit: Payer: Self-pay

## 2021-04-23 DIAGNOSIS — C228 Malignant neoplasm of liver, primary, unspecified as to type: Secondary | ICD-10-CM

## 2021-04-23 NOTE — Progress Notes (Signed)
[facility]   Telephone:([phone]) [phone] Fax:(336) [phone]   Clinic Follow up Note   Patient Care Team: Vesta Mixer as PCP - General (Physician Assistant)  Date of Service:  04/26/2021  CHIEF COMPLAINT: F/u of Hepatocellular carcinoma  SUMMARY OF ONCOLOGIC HISTORY: Oncology History Overview Note  Cancer Staging hepatocellular carcinoma Staging form: Liver, AJCC 8th Edition - Clinical stage from 12/28/2019: Stage IB (cT1b, cN0, cM0) - Signed by Truitt Merle, MD on 08/07/2020    hepatocellular carcinoma  12/28/2019 Imaging   MRI abdomen 12/28/19  IMPRESSION: 1. LI-RADS category LR-5 hepatocellular carcinoma measuring 2.1 by 1.7 cm lesion posteriorly in the right hepatic lobe with arterial phase enhancement, washout, and capsule appearance. Background cirrhosis noted. 2. No pancreatic mass is evident. There is a 1.4 cm peripancreatic lymph node which is likely reactive. Based on corroboration with the prior ultrasound, I suspect that the masslike appearance adjacent to the pancreatic head was due to duodenum. 3. Several small cysts or biliary hamartomas in the liver. 4. Clustered cystic lesions along the left hepatic lobe hilum are difficult to confidently separate from the biliary tree, and could represent type V clustered choledochal cysts. 5. Mild splenomegaly. 6. Paraesophageal varices, gastric varices, and splenorenal shunting compatible with portal venous hypertension.   12/28/2019 Cancer Staging   Staging form: Liver, AJCC 8th Edition - Clinical stage from 12/28/2019: Stage IB (cT1b, cN0, cM0) - Signed by Truitt Merle, MD on 08/07/2020    01/07/2020 Procedure   Upper endoscopy by Dr Silverio Decamp 01/07/20  IMPRESSION - Z-line regular, 36 cm from the incisors. - Grade II small esophageal varices. - Small Type 1 isolated gastric varices (IGV1, varices located in the fundus), without bleeding. 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 -  Normal examined duodenum. - No specimens collected.   01/07/2020 Procedure   Colonoscopy by Dr Silverio Decamp 01/07/20 IMPRESSION - One 7 mm polyp in the cecum, removed with a cold snare. Resected and retrieved. - One 16 mm polyp in the descending colon, removed with a hot snare. Resected and retrieved. - Moderate diverticulosis in the sigmoid colon, in the descending colon, in the transverse colon and in the ascending colon. - Non-bleeding internal hemorrhoids.    01/07/2020 Pathology Results   FINAL MICROSCOPIC DIAGNOSIS: 01/07/20 A. COLON, CECUM, POLYPECTOMY:  - Tubular adenoma.  - No high-grade dysplasia or carcinoma.   B. COLON, DESCENDING, POLYPECTOMY:  - Tubular adenoma.  - No high-grade dysplasia or carcinoma.   GROSS DESCRIPTION:  A: Received in formalin is a tan, soft tissue fragment that is submitted  in toto.  Size: 0.6 cm, 1 block submitted.  B: Received in formalin is a 1.0 x 0.9 x 0.8 cm rubbery tan-red mucosal  polyp.  The specimen is inked, sectioned and entirely submitted in 1  cassette.  (GRP 01/07/2020)    01/30/2020 Initial Diagnosis   Malignant neoplasm of liver, primary ([place])   02/10/2020 Imaging   CT Chest  IMPRESSION: No evidence of intrathoracic metastatic disease.   Aortic atherosclerosis.  Coronary artery calcification.   Calcified granulomas in the right lower lobe with small right-sided calcified nodes.   Right lobe liver lesion as shown by previous MRI. Gallstones. Upper abdominal varices.   03/04/2020 Procedure   CT Microwave liver ablation by Dr Laurence Ferrari   07/25/2020 Imaging   MRI Abdomen  IMPRESSION: 1. No viable tissue identified at the thermal ablation site in the medial RIGHT hepatic lobe. 2. No evidence hepatoma elsewhere in the  liver. 3. Cholelithiasis without evidence cholecystitis.     02/02/2021 Imaging   US Abdomen  IMPRESSION: Cholelithiasis with a small amount of pericholecystic fluid and mildly prominent gallbladder wall.  This is non-specific however could be due to physiologic under distension versus mild acute cholecystitis.   Findings suggestive of cirrhosis   02/02/2021 Imaging   CT AP    IMPRESSION: 1. No acute intra-abdominal or pelvic pathology. 2. Cirrhosis with evidence of portal hypertension and upper abdominal varices. Prior ablation defect in the right lobe of the liver. 3. Cholelithiasis. 4. Colonic diverticulosis. No bowel obstruction. Normal appendix. 5. Aortic Atherosclerosis (ICD10-I70.0).     04/05/2021 Imaging   MRI Abdomen  IMPRESSION: 1. Status post ablation in the right lobe of the liver with no findings to suggest local recurrence of disease or new aggressive lesions in the liver on today's study. 2. Small cavernous hemangioma in segment 4A of the liver, stable compared to prior studies. 3. Cirrhosis with evidence of portal venous hypertension, including splenomegaly and portosystemic collateral pathways, as detailed above.      CURRENT THERAPY:  Observation   INTERVAL HISTORY:  Fernando Ryan is here for a follow up of [place]. He was last seen by our clinic in 01/2020. He presents to the clinic alone. He notes upper extremity pain including his wrist, elbow and shoulder and some in his neck. He notes this has been going on for 6-8 months. He notes any other joint pain is not related. He denies pain back. He notes large bruises intermittently on his arms. I reviewed his medication list with him. 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    REVIEW OF SYSTEMS:   Constitutional: Denies fevers, chills or abnormal weight loss Eyes: Denies blurriness of vision Ears, nose, mouth, throat, and face: Denies mucositis or sore throat Respiratory: Denies cough, dyspnea or wheezes Cardiovascular: Denies palpitation, chest discomfort or lower extremity swelling Gastrointestinal:  Denies nausea, heartburn or change in bowel habits Skin: Denies abnormal  skin rashes MSK: (+) Upper extremity joint pain  Lymphatics: Denies new lymphadenopathy (+) easy bruising Neurological:Denies numbness, tingling or new weaknesses Behavioral/Psych: Mood is stable, no new changes  All other systems were reviewed with the patient and are negative.  MEDICAL HISTORY:  Past Medical History:  Diagnosis Date   Hepatitis    Hep C   Hypertension     SURGICAL HISTORY: Past Surgical History:  Procedure Laterality Date   COLONOSCOPY WITH PROPOFOL N/A 01/07/2020   Procedure: COLONOSCOPY WITH PROPOFOL;  Surgeon: Mauri Pole, MD;  Location: [facility];  Service: Endoscopy;  Laterality: N/A;   ESOPHAGOGASTRODUODENOSCOPY (EGD) WITH PROPOFOL N/A 01/07/2020   Procedure: ESOPHAGOGASTRODUODENOSCOPY (EGD) WITH PROPOFOL;  Surgeon: Mauri Pole, MD;  [facility]: [facility];  Service: Endoscopy;  Laterality: N/A;   FEMUR FRACTURE SURGERY     HEMOSTASIS CLIP PLACEMENT  01/07/2020   Procedure: HEMOSTASIS CLIP PLACEMENT;  Surgeon: Mauri Pole, MD;  [facility]: [facility];  Service: Endoscopy;;   IR RADIOLOGIST EVAL & MGMT  01/28/2020   IR RADIOLOGIST EVAL & MGMT  04/09/2020   IR RADIOLOGIST EVAL & MGMT  08/26/2020   POLYPECTOMY  01/07/2020   Procedure: POLYPECTOMY;  Surgeon: Mauri Pole, MD;  [facility];  Service: Endoscopy;;   RADIOLOGY WITH ANESTHESIA N/A 03/04/2020   Procedure: CT WITH ANESTHESIA  MICROWAVE ABLATION;  Surgeon: Jacqulynn Cadet, MD;  Location: [facility];  Service: Anesthesiology;  Laterality: N/A;  I have reviewed the social history and family history with the patient and they are unchanged from previous note.  ALLERGIES:  has No Known Allergies.  MEDICATIONS:  Current Outpatient Medications  Medication Sig Dispense Refill   Cyanocobalamin (VITAMIN B 12) 500 MCG TABS Take 500 mg by mouth 3 (three) times a week.      levothyroxine (SYNTHROID) 50 MCG tablet Take 50 mcg by mouth daily.      lidocaine (LIDODERM) 5 %  Place 1 patch onto the skin daily. Remove & Discard patch within 12 hours or as directed by MD 30 patch 0   lisinopril (ZESTRIL) 10 MG tablet Take 10 mg by mouth daily.     methocarbamol (ROBAXIN) 500 MG tablet Take 1 tablet (500 mg total) by mouth 2 (two) times daily. 20 tablet 0   nadolol (CORGARD) 20 MG tablet Take 1 tablet (20 mg total) by mouth daily. 90 tablet 3   ondansetron (ZOFRAN ODT) 4 MG disintegrating tablet 4mg  ODT q4 hours prn nausea/vomit 20 tablet 0   oxyCODONE (ROXICODONE) 5 MG immediate release tablet Take 1 tablet (5 mg total) by mouth every 6 (six) hours as needed for severe pain. 15 tablet 0   No current facility-administered medications for this visit.    PHYSICAL EXAMINATION: ECOG PERFORMANCE STATUS: 1 - Symptomatic but completely ambulatory  Vitals:   04/26/21 0943  BP: (!) 163/71  Pulse: (!) 58  Resp: 19  Temp: (!) 97.5 F (36.4 C)  SpO2: 100%   Filed Weights   04/26/21 0943  Weight: 136 lb 8 oz (61.9 kg)    GENERAL:alert, no distress and comfortable SKIN: skin color, texture, turgor are normal, no rashes or significant lesions (+) Skin bruising of arms and chest EYES: normal, Conjunctiva are pink and non-injected, sclera clear  NECK: supple, thyroid normal size, non-tender, without nodularity LYMPH:  no palpable lymphadenopathy in the cervical, axillary  LUNGS: clear to auscultation and percussion with normal breathing effort HEART: regular rate & rhythm and no murmurs and no lower extremity edema ABDOMEN:abdomen soft, non-tender and normal bowel sounds, mild hepatomegaly without tenderness Musculoskeletal:no cyanosis of digits and no clubbing  NEURO: alert & oriented x 3 with fluent speech, no focal motor/sensory deficits  LABORATORY DATA:  I have reviewed the data as listed CBC Latest Ref Rng & Units 04/26/2021 02/02/2021 05/20/2020  WBC 4.0 - 10.5 K/uL 4.3 5.5 4.9  Hemoglobin 13.0 - 17.0 g/dL 10.6(L) 11.2(L) 12.7(L)  Hematocrit 39.0 - 52.0 %  29.9(L) 32.9(L) 35.8(L)  Platelets 150 - 400 K/uL 86(L) 96(L) 69(L)     CMP Latest Ref Rng & Units 04/26/2021 02/02/2021 05/20/2020  Glucose 70 - 99 mg/dL 96 88 115(H)  BUN 8 - 23 mg/dL 11 18 37(H)  Creatinine 0.61 - 1.24 mg/dL 0.80 1.37(H) 1.35(H)  Sodium 135 - 145 mmol/L 136 137 137  Potassium 3.5 - 5.1 mmol/L 4.2 4.5 4.2  Chloride 98 - 111 mmol/L 104 104 105  CO2 22 - 32 mmol/L 23 25 23   Calcium 8.9 - 10.3 mg/dL 9.8 9.5 9.7  Total Protein 6.5 - 8.1 g/dL 7.7 7.4 9.1(H)  Total Bilirubin 0.3 - 1.2 mg/dL 0.4 0.4 0.8  Alkaline Phos 38 - 126 U/L 75 65 90  AST 15 - 41 U/L 20 26 33  ALT 0 - 44 U/L 7 16 21       RADIOGRAPHIC STUDIES: I have personally reviewed the radiological images as listed and agreed with the findings in the report. No results found.  ASSESSMENT & PLAN:  Fernando Ryan is a 70 y.o. male with   1. Hepatocellular carcinoma -He was diagnosed in 12/2019. His 12/27/20 MRI abdomen showed a 2.1 x1.7 cm LR-5 liver lesion with typical image findings consistent with HCC, although his AFP is normal, the diagnosis of HCC is quite certain. A biopsy has not been recommended. There is an indeterminate 1.4 cm peripancreatic LN that warrants surveillance. CT chest was negative for metastasis.  -He was previously offered liver transplant by [facility], but he did not proceed. He is not a surgical resection candidate due to his underlying liver cirrhosis and portal hypertension. -He has evidence of esophageal and gastric varices, followed by GI Dr. Silverio Decamp -He proceeded with treatment by CT microwave ablation with Dr Laurence Ferrari on 03/04/20. He has been on observation since.  -If he has recurrence or metastasis, we would then consider systemic treatment options with oral therapy vs immunotherapy, biological therapy. We discussed [place] is not very sensitive to chemotherapy. -He is clinically doing well from [facility] standpoint. His MRI abdomen from 04/05/21 showed good response to previous  treatment, no new lesion. Labs reviewed, CBC and CMP WNL except mild anemia and Thrombocytopenia. Physical exam was unremarkable except hepatomegaly .  -Due to his recent diffuse upper extremity and shoulder pain, I will get bone scan to rule out bone metastasis.  Also I do not have very high suspicion given the location of his bone pain. 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 Silverio Decamp. On nadolol for varices. -Followed by Roosevelt Locks, NP of [facility] liver care who will follow him for transplant work up and ongoing care.  Patient has declined transplant. -He has thrombocytopenia and moderate intermittent skin bruising on his arms. I discussed this is related to his liver cirrhosis. I recommend Vit C and Vit K for his bruising. IF he requires surgery, I may give treatment to improve his platelet level. I recommend he contact me beforehand.    3. Hepatitis C -s/p 12 week treatment with Epclusa   4. Hereditary hemochromatosis, Mild Anemia -mutation HFE gene, single copy H63D gene mutation -his daughter is aware and plans to get tested -ferritin 821 prior to hep C treatment, improved to 177 after per [facility] note's -he never had phlebotomy. No mention of iron deposits on MRI. He understands to avoid iron supplementation.  -I discussed this can lead to iron deposits in his organs and cause organ failure. I discussed the option of treatment with therapeutic phlebotomy. However, he has mild anemia. Based on iron panel, may hold on Phlebotomy.   5. Upper Extremity joint pain  -He notes for the past 6-8 months he has wrist, elbow and shoulder pain.  -I recommend bone scan to evaluate for [place] metastasis, however, I have low suspicion. -I discussed returning to orthopedic surgeon as he may be able to get injections to help his pain.     PLAN: -Bone scan in 1-2 weeks to rule out a bone metastasis, will call his daughter with results -I encouraged him to follow-up with orthopedics for his wrist and shoulder pain -Lab and F/u in 6 months    No problem-specific Assessment & Plan notes found for this encounter.   Orders Placed This Encounter  Procedures   NM Bone Scan Whole Body    Standing Status:   Future  Standing Expiration Date:   04/26/2022    Order Specific Question:   If indicated for the ordered procedure, I authorize the administration of a radiopharmaceutical per Radiology protocol    Answer:   Yes    Order Specific Question:   Preferred imaging location?    Answer:   [facility]    All questions were answered. The patient knows to call the clinic with any problems, questions or concerns. No barriers to learning was detected. The total time spent in the appointment was 30 minutes.     Truitt Merle, MD 04/26/2021   I, Joslyn Devon, am acting as scribe for Truitt Merle, MD.   I have reviewed the above documentation for accuracy and completeness, and I agree with the above.

## 2021-04-26 ENCOUNTER — Inpatient Hospital Stay: Payer: Medicare HMO | Attending: Hematology

## 2021-04-26 ENCOUNTER — Encounter: Payer: Self-pay | Admitting: Hematology

## 2021-04-26 ENCOUNTER — Other Ambulatory Visit: Payer: Self-pay | Admitting: Hematology

## 2021-04-26 ENCOUNTER — Other Ambulatory Visit: Payer: Self-pay

## 2021-04-26 ENCOUNTER — Telehealth: Payer: Self-pay | Admitting: Hematology

## 2021-04-26 ENCOUNTER — Inpatient Hospital Stay (HOSPITAL_BASED_OUTPATIENT_CLINIC_OR_DEPARTMENT_OTHER): Payer: Medicare HMO | Admitting: Hematology

## 2021-04-26 VITALS — BP 163/71 | HR 58 | Temp 97.5°F | Resp 19 | Ht 66.0 in | Wt 136.5 lb

## 2021-04-26 DIAGNOSIS — K648 Other hemorrhoids: Secondary | ICD-10-CM | POA: Diagnosis not present

## 2021-04-26 DIAGNOSIS — R162 Hepatomegaly with splenomegaly, not elsewhere classified: Secondary | ICD-10-CM | POA: Insufficient documentation

## 2021-04-26 DIAGNOSIS — C228 Malignant neoplasm of liver, primary, unspecified as to type: Secondary | ICD-10-CM

## 2021-04-26 DIAGNOSIS — I700 Atherosclerosis of aorta: Secondary | ICD-10-CM | POA: Diagnosis not present

## 2021-04-26 DIAGNOSIS — I864 Gastric varices: Secondary | ICD-10-CM | POA: Diagnosis not present

## 2021-04-26 DIAGNOSIS — D649 Anemia, unspecified: Secondary | ICD-10-CM | POA: Insufficient documentation

## 2021-04-26 DIAGNOSIS — D124 Benign neoplasm of descending colon: Secondary | ICD-10-CM | POA: Diagnosis not present

## 2021-04-26 DIAGNOSIS — R970 Elevated carcinoembryonic antigen [CEA]: Secondary | ICD-10-CM | POA: Diagnosis not present

## 2021-04-26 DIAGNOSIS — K766 Portal hypertension: Secondary | ICD-10-CM | POA: Diagnosis not present

## 2021-04-26 DIAGNOSIS — C220 Liver cell carcinoma: Secondary | ICD-10-CM | POA: Diagnosis present

## 2021-04-26 DIAGNOSIS — K5730 Diverticulosis of large intestine without perforation or abscess without bleeding: Secondary | ICD-10-CM | POA: Diagnosis not present

## 2021-04-26 DIAGNOSIS — F1020 Alcohol dependence, uncomplicated: Secondary | ICD-10-CM | POA: Diagnosis not present

## 2021-04-26 DIAGNOSIS — M79603 Pain in arm, unspecified: Secondary | ICD-10-CM | POA: Insufficient documentation

## 2021-04-26 DIAGNOSIS — M25519 Pain in unspecified shoulder: Secondary | ICD-10-CM | POA: Insufficient documentation

## 2021-04-26 DIAGNOSIS — K8020 Calculus of gallbladder without cholecystitis without obstruction: Secondary | ICD-10-CM | POA: Diagnosis not present

## 2021-04-26 DIAGNOSIS — Z8619 Personal history of other infectious and parasitic diseases: Secondary | ICD-10-CM | POA: Insufficient documentation

## 2021-04-26 DIAGNOSIS — B1920 Unspecified viral hepatitis C without hepatic coma: Secondary | ICD-10-CM | POA: Diagnosis not present

## 2021-04-26 DIAGNOSIS — Z944 Liver transplant status: Secondary | ICD-10-CM | POA: Insufficient documentation

## 2021-04-26 DIAGNOSIS — Z79899 Other long term (current) drug therapy: Secondary | ICD-10-CM | POA: Diagnosis not present

## 2021-04-26 DIAGNOSIS — I2510 Atherosclerotic heart disease of native coronary artery without angina pectoris: Secondary | ICD-10-CM | POA: Insufficient documentation

## 2021-04-26 LAB — IRON AND TIBC
Iron: 86 ug/dL (ref 42–163)
Saturation Ratios: 27 % (ref 20–55)
TIBC: 316 ug/dL (ref 202–409)
UIBC: 230 ug/dL (ref 117–376)

## 2021-04-26 LAB — CMP (CANCER CENTER ONLY)
ALT: 7 U/L (ref 0–44)
AST: 20 U/L (ref 15–41)
Albumin: 3.2 g/dL — ABNORMAL LOW (ref 3.5–5.0)
Alkaline Phosphatase: 75 U/L (ref 38–126)
Anion gap: 9 (ref 5–15)
BUN: 11 mg/dL (ref 8–23)
CO2: 23 mmol/L (ref 22–32)
Calcium: 9.8 mg/dL (ref 8.9–10.3)
Chloride: 104 mmol/L (ref 98–111)
Creatinine: 0.8 mg/dL (ref 0.61–1.24)
GFR, Estimated: >60 mL/min (ref >60)
Glucose, Bld: 96 mg/dL (ref 70–99)
Potassium: 4.2 mmol/L (ref 3.5–5.1)
Sodium: 136 mmol/L (ref 135–145)
Total Bilirubin: 0.4 mg/dL (ref 0.3–1.2)
Total Protein: 7.7 g/dL (ref 6.5–8.1)

## 2021-04-26 LAB — CBC WITH DIFFERENTIAL (CANCER CENTER ONLY)
Abs Immature Granulocytes: 0.02 10*3/uL (ref 0.00–0.07)
Basophils Absolute: 0 10*3/uL (ref 0.0–0.1)
Basophils Relative: 1 %
Eosinophils Absolute: 0.2 10*3/uL (ref 0.0–0.5)
Eosinophils Relative: 6 %
HCT: 29.9 % — ABNORMAL LOW (ref 39.0–52.0)
Hemoglobin: 10.6 g/dL — ABNORMAL LOW (ref 13.0–17.0)
Immature Granulocytes: 1 %
Lymphocytes Relative: 30 %
Lymphs Abs: 1.3 10*3/uL (ref 0.7–4.0)
MCH: 31.3 pg (ref 26.0–34.0)
MCHC: 35.5 g/dL (ref 30.0–36.0)
MCV: 88.2 fL (ref 80.0–100.0)
Monocytes Absolute: 0.3 10*3/uL (ref 0.1–1.0)
Monocytes Relative: 8 %
Neutro Abs: 2.4 10*3/uL (ref 1.7–7.7)
Neutrophils Relative %: 54 %
Platelet Count: 86 10*3/uL — ABNORMAL LOW (ref 150–400)
RBC: 3.39 MIL/uL — ABNORMAL LOW (ref 4.22–5.81)
RDW: 11.9 % (ref 11.5–15.5)
WBC Count: 4.3 10*3/uL (ref 4.0–10.5)
nRBC: 0 % (ref 0.0–0.2)

## 2021-04-26 LAB — FERRITIN: Ferritin: 290 ng/mL (ref 24–336)

## 2021-04-26 LAB — CEA (IN HOUSE-CHCC): CEA (CHCC-In House): 6.54 ng/mL — ABNORMAL HIGH (ref 0.00–5.00)

## 2021-04-26 NOTE — Telephone Encounter (Signed)
Scheduled per los. Gave avs and calendar  

## 2021-04-27 LAB — AFP TUMOR MARKER: AFP, Serum, Tumor Marker: 2 ng/mL (ref 0.0–8.4)

## 2021-05-06 ENCOUNTER — Encounter (HOSPITAL_COMMUNITY)
Admission: RE | Admit: 2021-05-06 | Discharge: 2021-05-06 | Disposition: A | Discharge status: Home / Self Care | Payer: Medicare HMO | Source: Ambulatory Visit | Attending: Hematology | Admitting: Hematology

## 2021-05-06 ENCOUNTER — Other Ambulatory Visit: Payer: Self-pay

## 2021-05-06 DIAGNOSIS — C228 Malignant neoplasm of liver, primary, unspecified as to type: Secondary | ICD-10-CM | POA: Diagnosis not present

## 2021-05-06 MED ORDER — TECHNETIUM TC 99M MEDRONATE IV KIT
20.0000 | PACK | Freq: Once | INTRAVENOUS | Status: AC | PRN
  Administered 2021-05-06: 20.5 via INTRAVENOUS

## 2021-05-10 ENCOUNTER — Telehealth: Payer: Self-pay

## 2021-05-10 NOTE — Telephone Encounter (Signed)
Fernando Ryan's daughter Sharyn Lull called requesting bone scan results.  I reviewed results with her.  There was no cancer detected in his bones.  I did let her know arthritis was seen.  Se requested a copy of the bone scan results be faxed to [facility], this has been done.

## 2021-05-25 ENCOUNTER — Other Ambulatory Visit: Payer: Self-pay | Admitting: Nurse Practitioner

## 2021-05-25 DIAGNOSIS — C220 Liver cell carcinoma: Secondary | ICD-10-CM

## 2021-06-08 ENCOUNTER — Other Ambulatory Visit: Payer: Self-pay

## 2021-06-08 ENCOUNTER — Encounter: Payer: Self-pay | Admitting: *Deleted

## 2021-06-08 ENCOUNTER — Ambulatory Visit
Admission: RE | Admit: 2021-06-08 | Discharge: 2021-06-08 | Disposition: A | Discharge status: Home / Self Care | Payer: Medicare HMO | Source: Ambulatory Visit | Attending: Interventional Radiology | Admitting: Interventional Radiology

## 2021-06-08 DIAGNOSIS — C220 Liver cell carcinoma: Secondary | ICD-10-CM

## 2021-06-08 HISTORY — PX: IR RADIOLOGIST EVAL & MGMT: IMG5224

## 2021-06-08 NOTE — Progress Notes (Signed)
Chief Complaint: Patient was consulted remotely today (TeleHealth) for at the request of Fernando Ryan K.    Referring Physician(s): Fernando Locks, NP  History of Present Illness: Fernando Ryan is a 70 y.o. male with alcoholic and HCV cirrhosis presents at the kind request of Fernando Ryan, hepatology nurse practitioner to discuss liver directed options for treatment of his newly discovered hepatocellular carcinoma.   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  He has been sober since this past November 2020.  He recently completed 12-week antiviral therapy with Epclusa for his hepatitis C.   MR imaging from 12/28/2019 demonstrates a 2.1 x 1.7 cm lesion in the posterior and medial aspect of hepatic segment 6.  Lesion is consistent with a Li-RADS category 5, diagnostic for hepatocellular carcinoma.     He underwent percutaneous thermal ablation on 03/04/2020.    MRI 07/25/20: No viable tissue identified at the thermal ablation site in the medial RIGHT hepatic lobe.  MRI 04/05/20: Status post ablation in the right lobe of the liver with no findings to suggest local recurrence of disease or new aggressive lesions in the liver on today's study.  Today, we meet via teleconference for follow-up.  He is is doing well and remains essentially asymptomatic.  He is very pleased to hear that he remains disease-free at over 1 year post procedure.  His appetite is good.  He has no significant fatigue, fever, chills or other systemic complaints.  Past Medical History:  Diagnosis Date   Hepatitis    Hep C   Hypertension     Past Surgical History:  Procedure Laterality Date   COLONOSCOPY WITH PROPOFOL N/A 01/07/2020   Procedure: COLONOSCOPY WITH PROPOFOL;  Surgeon: Fernando Pole, MD;  [facility];  Service: Endoscopy;  Laterality: N/A;   ESOPHAGOGASTRODUODENOSCOPY (EGD) WITH PROPOFOL N/A 01/07/2020   Procedure:  ESOPHAGOGASTRODUODENOSCOPY (EGD) WITH PROPOFOL;  Surgeon: Fernando Pole, MD;  [facility];  Service: Endoscopy;  Laterality: N/A;   FEMUR FRACTURE SURGERY     HEMOSTASIS CLIP PLACEMENT  01/07/2020   Procedure: HEMOSTASIS CLIP PLACEMENT;  Surgeon: Fernando Pole, MD;  [facility];  Service: Endoscopy;;   IR RADIOLOGIST EVAL & MGMT  01/28/2020   IR RADIOLOGIST EVAL & MGMT  04/09/2020   IR RADIOLOGIST EVAL & MGMT  08/26/2020   IR RADIOLOGIST EVAL & MGMT  06/08/2021   POLYPECTOMY  01/07/2020   Procedure: POLYPECTOMY;  Surgeon: Fernando Pole, MD;  [facility];  Service: Endoscopy;;   RADIOLOGY WITH ANESTHESIA N/A 03/04/2020   Procedure: CT WITH ANESTHESIA  MICROWAVE ABLATION;  Surgeon: Fernando Cadet, MD;  Location: [facility];  Service: Anesthesiology;  Laterality: N/A;    Allergies: Patient has no known allergies.  Medications: Prior to Admission medications   Medication Sig Start Date End Date Taking? Authorizing Provider  Cyanocobalamin (VITAMIN B 12) 500 MCG TABS Take 500 mg by mouth 3 (three) times a week.     [provider]  levothyroxine (SYNTHROID) 50 MCG tablet Take 50 mcg by mouth daily.  12/23/19   [provider]  lidocaine (LIDODERM) 5 % Place 1 patch onto the skin daily. Remove & Discard patch within 12 hours or as directed by MD 10/11/20   Ward, Delice Bison, DO  lisinopril (ZESTRIL) 10 MG tablet Take 10 mg by mouth daily. 12/23/19   [provider]  methocarbamol (ROBAXIN) 500 MG tablet Take 1 tablet (  500 mg total) by mouth 2 (two) times daily. 05/20/20   Tedd Sias, PA  nadolol (CORGARD) 20 MG tablet Take 1 tablet (20 mg total) by mouth daily. 07/16/20 07/16/21  Fernando Pole, MD  ondansetron (ZOFRAN ODT) 4 MG disintegrating tablet '4mg'$  ODT q4 hours prn nausea/vomit 02/03/21   Deno Etienne, DO  oxyCODONE (ROXICODONE) 5 MG immediate release tablet Take 1 tablet (5 mg total) by mouth every 6 (six) hours as needed  for severe pain. 10/11/20   Ward, Delice Bison, DO     Family History  Problem Relation Age of Onset   Diabetes Mother    Heart disease Mother    Heart disease Father    CAD Brother    Colon cancer Neg Hx    Esophageal cancer Neg Hx    Pancreatic cancer Neg Hx    Stomach cancer Neg Hx     Social History   Socioeconomic History   Marital status: Single    Spouse name: Not on file   Number of children: 1   Years of education: Not on file   Highest education level: Not on file  Occupational History   Occupation: Tile    Comment: part time   Tobacco Use   Smoking status: Every Day    Packs/day: 0.25    Years: 45.00    Pack years: 11.25    Types: Cigarettes   Smokeless tobacco: Never  Vaping Use   Vaping Use: Never used  Substance and Sexual Activity   Alcohol use: Not Currently    Comment: quit 09/2019, previous 40 year history 12 drinks daily    Drug use: Not Currently   Sexual activity: Not on file  Other Topics Concern   Not on file  Social History Narrative   Not on file   Social Determinants of Health   Financial Resource Strain: Not on file  Food Insecurity: Not on file  Transportation Needs: Not on file  Physical Activity: Not on file  Stress: Not on file  Social Connections: Not on file    ECOG Status: 0 - Asymptomatic  Review of Systems  Review of Systems: A 12 point ROS discussed and pertinent positives are indicated in the HPI above.  All other systems are negative.  Physical Exam No direct physical exam was performed (except for noted visual exam findings with Video Visits).   Vital Signs: There were no vitals taken for this visit.  Imaging: IR Radiologist Eval & Mgmt  Result Date: 06/08/2021 Please refer to notes tab for details about interventional procedure. (Op Note)   Labs:  CBC: Recent Labs    02/02/21 1915 04/26/21 0932  WBC 5.5 4.3  HGB 11.2* 10.6*  HCT 32.9* 29.9*  PLT 96* 86*    COAGS: No results for input(s): INR,  APTT in the last 8760 hours.  BMP: Recent Labs    02/02/21 1915 04/26/21 0932  NA 137 136  K 4.5 4.2  CL 104 104  CO2 25 23  GLUCOSE 88 96  BUN 18 11  CALCIUM 9.5 9.8  CREATININE 1.37* 0.80  GFRNONAA 55* >60    LIVER FUNCTION TESTS: Recent Labs    02/02/21 1915 04/26/21 0932  BILITOT 0.4 0.4  AST 26 20  ALT 16 7  ALKPHOS 65 75  PROT 7.4 7.7  ALBUMIN 3.3* 3.2*    TUMOR MARKERS: No results for input(s): AFPTM, CEA, CA199, CHROMGRNA in the last 8760 hours.  Assessment and Plan:  70 year old male  with alcoholic and HCV cirrhosis complicated by solitary 2.1 cm hepatocellular carcinoma in the posteromedial aspect of hepatic segment 6.  He underwent definitive treatment with percutaneous microwave ablation on 03/04/2020.  Subsequent surveillance imaging has demonstrated no evidence of residual, recurrent or new disease.  He continues to do well clinically and remains in disease remission.  We will continue active imaging surveillance.  1.)  Repeat MRI of the abdomen with gadolinium contrast and clinic visit in 6 months.  Patient request that we contact his daughter for scheduling.    Electronically Signed: Criselda Peaches 06/08/2021, 12:23 PM   I spent a total of 15 Minutes in remote  clinical consultation, greater than 50% of which was counseling/coordinating care for hepatocellular cancer.    Visit type: Audio only (telephone). Audio (no video) only due to patient preference. Alternative for in-person consultation at [facility], [address], [place], [place]. This visit type was conducted due to national recommendations for restrictions regarding the COVID-19 Pandemic (e.g. social distancing).  This format is felt to be most appropriate for this patient at this time.  All issues noted in this document were discussed and addressed.

## 2021-07-05 ENCOUNTER — Other Ambulatory Visit: Payer: Self-pay | Admitting: Gastroenterology

## 2021-07-26 ENCOUNTER — Other Ambulatory Visit: Payer: Medicare HMO

## 2021-10-17 ENCOUNTER — Inpatient Hospital Stay: Admission: RE | Admit: 2021-10-17 | Payer: Medicare HMO | Source: Ambulatory Visit

## 2021-10-25 ENCOUNTER — Inpatient Hospital Stay: Payer: Medicare HMO

## 2021-10-25 ENCOUNTER — Inpatient Hospital Stay: Payer: Medicare HMO | Admitting: Hematology

## 2021-11-04 ENCOUNTER — Other Ambulatory Visit: Payer: Self-pay

## 2021-11-04 ENCOUNTER — Ambulatory Visit
Admission: RE | Admit: 2021-11-04 | Discharge: 2021-11-04 | Disposition: A | Discharge status: Home / Self Care | Payer: Medicare HMO | Source: Ambulatory Visit | Attending: Nurse Practitioner | Admitting: Nurse Practitioner

## 2021-11-04 DIAGNOSIS — C220 Liver cell carcinoma: Secondary | ICD-10-CM

## 2021-11-04 MED ORDER — GADOBENATE DIMEGLUMINE 529 MG/ML IV SOLN
13.0000 mL | Freq: Once | INTRAVENOUS | Status: AC | PRN
  Administered 2021-11-04: 14:00:00 13 mL via INTRAVENOUS

## 2021-11-12 ENCOUNTER — Inpatient Hospital Stay: Payer: Medicare HMO | Admitting: Hematology

## 2021-11-12 ENCOUNTER — Inpatient Hospital Stay: Payer: Medicare HMO | Attending: Hematology

## 2021-11-24 ENCOUNTER — Other Ambulatory Visit: Payer: Self-pay | Admitting: Interventional Radiology

## 2021-11-24 DIAGNOSIS — C220 Liver cell carcinoma: Secondary | ICD-10-CM

## 2021-12-03 ENCOUNTER — Ambulatory Visit
Admission: RE | Admit: 2021-12-03 | Discharge: 2021-12-03 | Disposition: A | Discharge status: Home / Self Care | Payer: Medicare HMO | Source: Ambulatory Visit | Attending: Interventional Radiology | Admitting: Interventional Radiology

## 2021-12-03 ENCOUNTER — Other Ambulatory Visit: Payer: Self-pay

## 2021-12-03 ENCOUNTER — Encounter: Payer: Self-pay | Admitting: *Deleted

## 2021-12-03 DIAGNOSIS — C220 Liver cell carcinoma: Secondary | ICD-10-CM

## 2021-12-03 HISTORY — PX: IR RADIOLOGIST EVAL & MGMT: IMG5224

## 2021-12-03 NOTE — Progress Notes (Signed)
Chief Complaint: Patient was consulted remotely today (TeleHealth) for hepatocellular carcinoma at the request of Anuoluwapo Mefferd K.    Referring Physician(s): Roosevelt Locks, NP  History of Present Illness: Fernando Ryan is a 71 y.o. 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  Fernando Ryan has been sober since this past November 2020.  Fernando Ryan recently completed 12-week antiviral therapy with Epclusa for his hepatitis C.   MR imaging from 12/28/2019 demonstrates a 2.1 x 1.7 cm lesion in the posterior and medial aspect of hepatic segment 6.  Lesion is consistent with a Li-RADS category 5, diagnostic for hepatocellular carcinoma.     Fernando Ryan underwent percutaneous thermal ablation on 03/04/2020.    MRI 07/25/20: 1. No viable tissue identified at the thermal ablation site in the medial RIGHT hepatic lobe.  2. No evidence hepatoma elsewhere in the liver.  MRI 04/05/21: Status post ablation in the right lobe of the liver with no findings to suggest local recurrence of disease or new aggressive lesions in the liver on today's study.  MRI 11/05/21: 1. Unchanged ablation defect of the posterior right lobe of the liver, hepatic segment VI, without residual contrast enhancement. 2. No new suspicious liver lesions.  Today, we meet via teleconference for follow-up.  Fernando Ryan is pleased to hear that his MRI continues to demonstrate an excellent response to therapy and that Fernando Ryan is essentially disease-free.  His appetite is good.  Fernando Ryan has no significant fatigue, fever, chills or other systemic complaints.  Past Medical History:  Diagnosis Date   Hepatitis    Hep C   Hypertension     Past Surgical History:  Procedure Laterality Date   COLONOSCOPY  WITH PROPOFOL N/A 01/07/2020   Procedure: COLONOSCOPY WITH PROPOFOL;  Surgeon: Mauri Pole, MD;  Location: [facility];  Service: Endoscopy;  Laterality: N/A;   ESOPHAGOGASTRODUODENOSCOPY (EGD) WITH PROPOFOL N/A 01/07/2020   Procedure: ESOPHAGOGASTRODUODENOSCOPY (EGD) WITH PROPOFOL;  Surgeon: Mauri Pole, MD;  Location: [facility];  Service: Endoscopy;  Laterality: N/A;   FEMUR FRACTURE SURGERY     HEMOSTASIS CLIP PLACEMENT  01/07/2020   Procedure: HEMOSTASIS CLIP PLACEMENT;  Surgeon: Mauri Pole, MD;  Location: [facility];  Service: Endoscopy;;   IR RADIOLOGIST EVAL & MGMT  01/28/2020   IR RADIOLOGIST EVAL & MGMT  04/09/2020   IR RADIOLOGIST EVAL & MGMT  08/26/2020   IR RADIOLOGIST EVAL & MGMT  06/08/2021   POLYPECTOMY  01/07/2020   Procedure: POLYPECTOMY;  Surgeon: Mauri Pole, MD;  Location: [facility];  Service: Endoscopy;;   RADIOLOGY WITH ANESTHESIA N/A 03/04/2020   Procedure: CT WITH ANESTHESIA  MICROWAVE ABLATION;  Surgeon: Jacqulynn Cadet, MD;  Location: [facility];  Service: Anesthesiology;  Laterality: N/A;    Allergies: Patient has no known allergies.  Medications: Prior to Admission medications   Medication Sig Start Date End Date Taking? Authorizing Provider  Cyanocobalamin (VITAMIN B 12) 500 MCG TABS Take 500 mg by mouth 3 (three) times a week.     [provider]  levothyroxine (SYNTHROID) 50 MCG tablet Take 50 mcg by mouth daily.  12/23/19   [provider]  lidocaine (LIDODERM) 5 % Place 1 patch onto the skin daily. Remove & Discard patch within 12 hours or as directed  by MD 10/11/20   Ward, Delice Bison, DO  lisinopril (ZESTRIL) 10 MG tablet Take 10 mg by mouth daily. 12/23/19   [provider]  methocarbamol (ROBAXIN) 500 MG tablet Take 1 tablet (500 mg total) by mouth 2 (two) times daily. 05/20/20   Tedd Sias, PA  nadolol (CORGARD) 20 MG tablet Take 1 tablet (20 mg total) by mouth daily. 07/16/20 07/16/21  Mauri Pole, MD  ondansetron (ZOFRAN ODT) 4 MG disintegrating tablet 4mg  ODT q4 hours prn nausea/vomit 02/03/21   Deno Etienne, DO  oxyCODONE (ROXICODONE) 5 MG immediate release tablet Take 1 tablet (5 mg total) by mouth every 6 (six) hours as needed for severe pain. 10/11/20   Ward, Delice Bison, DO     Family History  Problem Relation Age of Onset   Diabetes Mother    Heart disease Mother    Heart disease Father    CAD Brother    Colon cancer Neg Hx    Esophageal cancer Neg Hx    Pancreatic cancer Neg Hx    Stomach cancer Neg Hx     Social History   Socioeconomic History   Marital status: Single    Spouse name: Not on file   Number of children: 1   Years of education: Not on file   Highest education level: Not on file  Occupational History   Occupation: Tile    Comment: part time   Tobacco Use   Smoking status: Every Day    Packs/day: 0.25    Years: 45.00    Pack years: 11.25    Types: Cigarettes   Smokeless tobacco: Never  Vaping Use   Vaping Use: Never used  Substance and Sexual Activity   Alcohol use: Not Currently    Comment: quit 09/2019, previous 40 year history 12 drinks daily    Drug use: Not Currently   Sexual activity: Not on file  Other Topics Concern   Not on file  Social History Narrative   Not on file   Social Determinants of Health   Financial Resource Strain: Not on file  Food Insecurity: Not on file  Transportation Needs: Not on file  Physical Activity: Not on file  Stress: Not on file  Social Connections: Not on file    Review of Systems  Review of Systems: A 12 point ROS discussed and pertinent positives are indicated in the HPI above.  All other systems are negative.  Physical Exam No direct physical exam was performed (except for noted visual exam findings with Video Visits).    Vital Signs: There were no vitals taken for this visit.  Imaging: MR ABDOMEN WWO CONTRAST  Result Date: 11/05/2021 CLINICAL DATA:  Hepatocellular carcinoma  EXAM: MRI ABDOMEN WITHOUT AND WITH CONTRAST TECHNIQUE: Multiplanar multisequence MR imaging of the abdomen was performed both before and after the administration of intravenous contrast. CONTRAST:  38mL MULTIHANCE GADOBENATE DIMEGLUMINE 529 MG/ML IV SOLN COMPARISON:  04/05/2021 FINDINGS: Lower chest: No acute findings. Hepatobiliary: Coarse, nodular contour of the liver. Unchanged ablation defect of the posterior right lobe of the liver, hepatic segment VI, without residual contrast enhancement (series 13, image 37). Unchanged subcentimeter flash filling hemangioma of the anterior left lobe of the liver, hepatic segment IVA, measuring 0.7 cm (series 11, image 28). Small gallstones in the dependent gallbladder. No gallbladder wall thickening. No biliary ductal dilatation. Pancreas: No mass, inflammatory changes, or other parenchymal abnormality identified. No pancreatic ductal dilatation. Spleen:  Unchanged splenomegaly, maximum coronal span 15.3  cm. Adrenals/Urinary Tract: No masses identified. No evidence of hydronephrosis. Stomach/Bowel: Visualized portions within the abdomen are unremarkable. Vascular/Lymphatic: No pathologically enlarged lymph nodes identified. No abdominal aortic aneurysm demonstrated. Redemonstrated splenic and gastroesophageal varices. Other:  None. Musculoskeletal: No suspicious bone lesions identified. IMPRESSION: 1. Unchanged ablation defect of the posterior right lobe of the liver, hepatic segment VI, without residual contrast enhancement. 2. No new suspicious liver lesions. 3. Unchanged subcentimeter flash filling hemangioma of the anterior left lobe of the liver, hepatic segment IVA. 4. Stigmata of cirrhosis with splenomegaly as well as splenic and gastroesophageal varices. 5. Cholelithiasis. Electronically Signed   By: Delanna Ahmadi M.D.   On: 11/05/2021 10:18    Labs:  CBC: Recent Labs    02/02/21 1915 04/26/21 0932  WBC 5.5 4.3  HGB 11.2* 10.6*  HCT 32.9* 29.9*  PLT 96*  86*    COAGS: No results for input(s): INR, APTT in the last 8760 hours.  BMP: Recent Labs    02/02/21 1915 04/26/21 0932  NA 137 136  K 4.5 4.2  CL 104 104  CO2 25 23  GLUCOSE 88 96  BUN 18 11  CALCIUM 9.5 9.8  CREATININE 1.37* 0.80  GFRNONAA 55* >60    LIVER FUNCTION TESTS: Recent Labs    02/02/21 1915 04/26/21 0932  BILITOT 0.4 0.4  AST 26 20  ALT 16 7  ALKPHOS 65 75  PROT 7.4 7.7  ALBUMIN 3.3* 3.2*    TUMOR MARKERS: No results for input(s): AFPTM, CEA, CA199, CHROMGRNA in the last 8760 hours.  Assessment and Plan:  Fernando Ryan now 21 months status post percutaneous microwave ablation of hepatocellular carcinoma.  Imaging surveillance demonstrates no evidence of residual, recurrent or new disease.  Clinically, Fernando Ryan is doing well and has no complaints.  We will continue active surveillance.  Next MRI of the abdomen with gadolinium contrast and clinic visit in April 2023.  That will be his 2-year follow-up evaluation.  If no evidence of disease at that time, we will be able to move to surveillance imaging every 6 months.    Electronically Signed: Criselda Peaches 12/03/2021, 1:16 PM   I spent a total of    15 Minutes in remote  clinical consultation, greater than 50% of which was counseling/coordinating care for hepatocellular cancer.    Visit type: Audio only (telephone). Audio (no video) only due to patient preference. Alternative for in-person consultation at [facility], [address] [place], [place], [place]. This visit type was conducted due to national recommendations for restrictions regarding the COVID-19 Pandemic (e.g. social distancing).  This format is felt to be most appropriate for this patient at this time.  All issues noted in this document were discussed and addressed.

## 2022-02-03 ENCOUNTER — Other Ambulatory Visit: Payer: Self-pay | Admitting: Interventional Radiology

## 2022-02-03 ENCOUNTER — Other Ambulatory Visit: Payer: Self-pay | Admitting: Nurse Practitioner

## 2022-02-03 ENCOUNTER — Other Ambulatory Visit: Payer: Self-pay | Admitting: Transplant Surgery

## 2022-02-03 DIAGNOSIS — C220 Liver cell carcinoma: Secondary | ICD-10-CM

## 2022-02-14 DIAGNOSIS — M5412 Radiculopathy, cervical region: Secondary | ICD-10-CM | POA: Diagnosis not present

## 2022-02-14 DIAGNOSIS — K7030 Alcoholic cirrhosis of liver without ascites: Secondary | ICD-10-CM | POA: Diagnosis not present

## 2022-02-16 ENCOUNTER — Other Ambulatory Visit: Payer: Self-pay | Admitting: Nurse Practitioner

## 2022-02-16 DIAGNOSIS — C220 Liver cell carcinoma: Secondary | ICD-10-CM

## 2022-02-18 DIAGNOSIS — M5412 Radiculopathy, cervical region: Secondary | ICD-10-CM | POA: Diagnosis not present

## 2022-02-19 DIAGNOSIS — M069 Rheumatoid arthritis, unspecified: Secondary | ICD-10-CM | POA: Diagnosis not present

## 2022-02-19 DIAGNOSIS — M25511 Pain in right shoulder: Secondary | ICD-10-CM | POA: Diagnosis not present

## 2022-02-19 DIAGNOSIS — F1120 Opioid dependence, uncomplicated: Secondary | ICD-10-CM | POA: Diagnosis not present

## 2022-02-19 DIAGNOSIS — Z79891 Long term (current) use of opiate analgesic: Secondary | ICD-10-CM | POA: Diagnosis not present

## 2022-02-19 DIAGNOSIS — Z79899 Other long term (current) drug therapy: Secondary | ICD-10-CM | POA: Diagnosis not present

## 2022-02-19 DIAGNOSIS — Z6822 Body mass index (BMI) 22.0-22.9, adult: Secondary | ICD-10-CM | POA: Diagnosis not present

## 2022-02-19 DIAGNOSIS — M5030 Other cervical disc degeneration, unspecified cervical region: Secondary | ICD-10-CM | POA: Diagnosis not present

## 2022-02-22 DIAGNOSIS — Z79899 Other long term (current) drug therapy: Secondary | ICD-10-CM | POA: Diagnosis not present

## 2022-03-08 DIAGNOSIS — M4722 Other spondylosis with radiculopathy, cervical region: Secondary | ICD-10-CM | POA: Diagnosis not present

## 2022-03-14 ENCOUNTER — Other Ambulatory Visit: Payer: Medicare HMO

## 2022-03-19 DIAGNOSIS — M069 Rheumatoid arthritis, unspecified: Secondary | ICD-10-CM | POA: Diagnosis not present

## 2022-03-19 DIAGNOSIS — F1120 Opioid dependence, uncomplicated: Secondary | ICD-10-CM | POA: Diagnosis not present

## 2022-03-19 DIAGNOSIS — M25511 Pain in right shoulder: Secondary | ICD-10-CM | POA: Diagnosis not present

## 2022-03-19 DIAGNOSIS — M5030 Other cervical disc degeneration, unspecified cervical region: Secondary | ICD-10-CM | POA: Diagnosis not present

## 2022-03-19 DIAGNOSIS — Z79899 Other long term (current) drug therapy: Secondary | ICD-10-CM | POA: Diagnosis not present

## 2022-03-19 DIAGNOSIS — Z6821 Body mass index (BMI) 21.0-21.9, adult: Secondary | ICD-10-CM | POA: Diagnosis not present

## 2022-03-21 ENCOUNTER — Telehealth: Payer: Medicare HMO

## 2022-03-23 DIAGNOSIS — Z79899 Other long term (current) drug therapy: Secondary | ICD-10-CM | POA: Diagnosis not present

## 2022-03-25 ENCOUNTER — Other Ambulatory Visit: Payer: Medicare HMO

## 2022-04-05 ENCOUNTER — Ambulatory Visit: Payer: Medicare HMO

## 2022-04-15 DIAGNOSIS — Z79899 Other long term (current) drug therapy: Secondary | ICD-10-CM | POA: Diagnosis not present

## 2022-04-15 DIAGNOSIS — R825 Elevated urine levels of drugs, medicaments and biological substances: Secondary | ICD-10-CM | POA: Diagnosis not present

## 2022-04-15 DIAGNOSIS — F1120 Opioid dependence, uncomplicated: Secondary | ICD-10-CM | POA: Diagnosis not present

## 2022-04-15 DIAGNOSIS — Z6820 Body mass index (BMI) 20.0-20.9, adult: Secondary | ICD-10-CM | POA: Diagnosis not present

## 2022-04-15 DIAGNOSIS — M25511 Pain in right shoulder: Secondary | ICD-10-CM | POA: Diagnosis not present

## 2022-04-15 DIAGNOSIS — M5030 Other cervical disc degeneration, unspecified cervical region: Secondary | ICD-10-CM | POA: Diagnosis not present

## 2022-04-19 ENCOUNTER — Inpatient Hospital Stay: Admission: RE | Admit: 2022-04-19 | Payer: Medicare HMO | Source: Ambulatory Visit

## 2022-04-20 ENCOUNTER — Telehealth: Payer: Medicare HMO

## 2022-04-21 DIAGNOSIS — Z79899 Other long term (current) drug therapy: Secondary | ICD-10-CM | POA: Diagnosis not present

## 2022-05-13 DIAGNOSIS — Z6821 Body mass index (BMI) 21.0-21.9, adult: Secondary | ICD-10-CM | POA: Diagnosis not present

## 2022-05-13 DIAGNOSIS — R825 Elevated urine levels of drugs, medicaments and biological substances: Secondary | ICD-10-CM | POA: Diagnosis not present

## 2022-05-13 DIAGNOSIS — Z79891 Long term (current) use of opiate analgesic: Secondary | ICD-10-CM | POA: Diagnosis not present

## 2022-05-13 DIAGNOSIS — Z79899 Other long term (current) drug therapy: Secondary | ICD-10-CM | POA: Diagnosis not present

## 2022-05-13 DIAGNOSIS — M25511 Pain in right shoulder: Secondary | ICD-10-CM | POA: Diagnosis not present

## 2022-05-13 DIAGNOSIS — M5030 Other cervical disc degeneration, unspecified cervical region: Secondary | ICD-10-CM | POA: Diagnosis not present

## 2022-05-18 ENCOUNTER — Other Ambulatory Visit: Payer: Self-pay | Admitting: Nurse Practitioner

## 2022-05-18 DIAGNOSIS — K7469 Other cirrhosis of liver: Secondary | ICD-10-CM | POA: Diagnosis not present

## 2022-05-18 DIAGNOSIS — C220 Liver cell carcinoma: Secondary | ICD-10-CM | POA: Diagnosis not present

## 2022-05-18 DIAGNOSIS — E440 Moderate protein-calorie malnutrition: Secondary | ICD-10-CM | POA: Diagnosis not present

## 2022-05-18 DIAGNOSIS — Z79899 Other long term (current) drug therapy: Secondary | ICD-10-CM | POA: Diagnosis not present

## 2022-05-18 DIAGNOSIS — I8510 Secondary esophageal varices without bleeding: Secondary | ICD-10-CM | POA: Diagnosis not present

## 2022-05-27 ENCOUNTER — Inpatient Hospital Stay: Admission: RE | Admit: 2022-05-27 | Payer: Medicare HMO | Source: Ambulatory Visit

## 2022-06-08 ENCOUNTER — Other Ambulatory Visit: Payer: Medicare HMO

## 2022-06-10 DIAGNOSIS — Z6821 Body mass index (BMI) 21.0-21.9, adult: Secondary | ICD-10-CM | POA: Diagnosis not present

## 2022-06-10 DIAGNOSIS — M5030 Other cervical disc degeneration, unspecified cervical region: Secondary | ICD-10-CM | POA: Diagnosis not present

## 2022-06-10 DIAGNOSIS — R825 Elevated urine levels of drugs, medicaments and biological substances: Secondary | ICD-10-CM | POA: Diagnosis not present

## 2022-06-10 DIAGNOSIS — F1120 Opioid dependence, uncomplicated: Secondary | ICD-10-CM | POA: Diagnosis not present

## 2022-06-10 DIAGNOSIS — Z79899 Other long term (current) drug therapy: Secondary | ICD-10-CM | POA: Diagnosis not present

## 2022-06-10 DIAGNOSIS — M069 Rheumatoid arthritis, unspecified: Secondary | ICD-10-CM | POA: Diagnosis not present

## 2022-06-10 DIAGNOSIS — Z79891 Long term (current) use of opiate analgesic: Secondary | ICD-10-CM | POA: Diagnosis not present

## 2022-06-10 DIAGNOSIS — M25511 Pain in right shoulder: Secondary | ICD-10-CM | POA: Diagnosis not present

## 2022-06-15 DIAGNOSIS — Z79899 Other long term (current) drug therapy: Secondary | ICD-10-CM | POA: Diagnosis not present

## 2022-06-16 DIAGNOSIS — R110 Nausea: Secondary | ICD-10-CM | POA: Diagnosis not present

## 2022-06-16 DIAGNOSIS — K7460 Unspecified cirrhosis of liver: Secondary | ICD-10-CM | POA: Diagnosis not present

## 2022-06-16 DIAGNOSIS — Z792 Long term (current) use of antibiotics: Secondary | ICD-10-CM | POA: Diagnosis not present

## 2022-06-16 DIAGNOSIS — K8020 Calculus of gallbladder without cholecystitis without obstruction: Secondary | ICD-10-CM | POA: Diagnosis not present

## 2022-06-16 DIAGNOSIS — M3390 Dermatopolymyositis, unspecified, organ involvement unspecified: Secondary | ICD-10-CM | POA: Diagnosis not present

## 2022-06-16 DIAGNOSIS — M542 Cervicalgia: Secondary | ICD-10-CM | POA: Diagnosis not present

## 2022-06-16 DIAGNOSIS — N390 Urinary tract infection, site not specified: Secondary | ICD-10-CM | POA: Diagnosis not present

## 2022-06-16 DIAGNOSIS — K3189 Other diseases of stomach and duodenum: Secondary | ICD-10-CM | POA: Diagnosis not present

## 2022-06-16 DIAGNOSIS — R531 Weakness: Secondary | ICD-10-CM | POA: Diagnosis not present

## 2022-06-16 DIAGNOSIS — K766 Portal hypertension: Secondary | ICD-10-CM | POA: Diagnosis not present

## 2022-06-16 DIAGNOSIS — I10 Essential (primary) hypertension: Secondary | ICD-10-CM | POA: Diagnosis not present

## 2022-06-16 DIAGNOSIS — K2970 Gastritis, unspecified, without bleeding: Secondary | ICD-10-CM | POA: Diagnosis not present

## 2022-07-03 ENCOUNTER — Encounter (HOSPITAL_COMMUNITY): Payer: Self-pay

## 2022-07-03 ENCOUNTER — Emergency Department (HOSPITAL_COMMUNITY)
Admission: EM | Admit: 2022-07-03 | Discharge: 2022-07-03 | Disposition: A | Discharge status: Home / Self Care | Payer: Medicare HMO | Attending: Emergency Medicine | Admitting: Emergency Medicine

## 2022-07-03 ENCOUNTER — Emergency Department (HOSPITAL_COMMUNITY): Payer: Medicare HMO

## 2022-07-03 ENCOUNTER — Other Ambulatory Visit: Payer: Self-pay

## 2022-07-03 DIAGNOSIS — Z79899 Other long term (current) drug therapy: Secondary | ICD-10-CM | POA: Diagnosis not present

## 2022-07-03 DIAGNOSIS — R4182 Altered mental status, unspecified: Secondary | ICD-10-CM | POA: Diagnosis not present

## 2022-07-03 DIAGNOSIS — M5412 Radiculopathy, cervical region: Secondary | ICD-10-CM | POA: Insufficient documentation

## 2022-07-03 DIAGNOSIS — I10 Essential (primary) hypertension: Secondary | ICD-10-CM | POA: Insufficient documentation

## 2022-07-03 DIAGNOSIS — M542 Cervicalgia: Secondary | ICD-10-CM | POA: Diagnosis present

## 2022-07-03 DIAGNOSIS — M4602 Spinal enthesopathy, cervical region: Secondary | ICD-10-CM | POA: Diagnosis not present

## 2022-07-03 DIAGNOSIS — M47812 Spondylosis without myelopathy or radiculopathy, cervical region: Secondary | ICD-10-CM | POA: Diagnosis not present

## 2022-07-03 LAB — BASIC METABOLIC PANEL
Anion gap: 8 (ref 5–15)
BUN: 18 mg/dL (ref 8–23)
CO2: 25 mmol/L (ref 22–32)
Calcium: 9.8 mg/dL (ref 8.9–10.3)
Chloride: 105 mmol/L (ref 98–111)
Creatinine, Ser: 0.9 mg/dL (ref 0.61–1.24)
GFR, Estimated: >60 mL/min (ref >60)
Glucose, Bld: 95 mg/dL (ref 70–99)
Potassium: 4.3 mmol/L (ref 3.5–5.1)
Sodium: 138 mmol/L (ref 135–145)

## 2022-07-03 LAB — CBC
HCT: 33.5 % — ABNORMAL LOW (ref 39.0–52.0)
Hemoglobin: 11.7 g/dL — ABNORMAL LOW (ref 13.0–17.0)
MCH: 31 pg (ref 26.0–34.0)
MCHC: 34.9 g/dL (ref 30.0–36.0)
MCV: 88.9 fL (ref 80.0–100.0)
Platelets: 107 10*3/uL — ABNORMAL LOW (ref 150–400)
RBC: 3.77 MIL/uL — ABNORMAL LOW (ref 4.22–5.81)
RDW: 13.2 % (ref 11.5–15.5)
WBC: 4.1 10*3/uL (ref 4.0–10.5)
nRBC: 0 % (ref 0.0–0.2)

## 2022-07-03 MED ORDER — HYDROMORPHONE HCL 2 MG/ML IJ SOLN
1.0000 mg | Freq: Once | INTRAMUSCULAR | Status: AC
  Administered 2022-07-03: 1 mg via INTRAVENOUS
  Filled 2022-07-03: qty 1

## 2022-07-03 MED ORDER — HYDROMORPHONE HCL 2 MG/ML IJ SOLN
0.5000 mg | Freq: Once | INTRAMUSCULAR | Status: AC
  Administered 2022-07-03: 0.5 mg via INTRAVENOUS
  Filled 2022-07-03: qty 1

## 2022-07-03 MED ORDER — DEXAMETHASONE SODIUM PHOSPHATE 10 MG/ML IJ SOLN
10.0000 mg | Freq: Once | INTRAMUSCULAR | Status: AC
  Administered 2022-07-03: 10 mg via INTRAVENOUS
  Filled 2022-07-03: qty 1

## 2022-07-03 MED ORDER — PREDNISONE 10 MG (21) PO TBPK: ORAL_TABLET | Freq: Every day | ORAL | 0 refills | Status: DC

## 2022-07-03 NOTE — ED Provider Notes (Signed)
[facility] DEPT Provider Note   CSN: [id number] Arrival date & time: 07/03/22  1017     History  Chief Complaint  Patient presents with   Neck Pain   Arm Pain    Fernando Ryan is a 71 y.o. male with medical history of hypertension, chronic posterior cervical pain with associated radiating arm pain right greater than left, paresthesias, weakness.  Patient presents to ED for evaluation of neck pain as well as bilateral shoulder pain.  The patient reports that he has a history of cervical radiculopathy, has been seen by Dr. Katherine Roan, neurosurgery.  The patient reports today that his neck pain is becoming excruciating, denies any recent event or trauma to account for this increased neck pain.  The patient states that he is currently under the care of a pain management clinic at [facility] where he receives 15 mg of oxycodone every 4 hours.  On chart view, it appears that this patient was recommended to undergo C5/6, C6/7 ACDF by Dr. Leonel Ramsay in April.  The patient and his family at the bedside reports that the patient is unable to undergo this procedure due to his low platelet count as a result of his hepatocellular carcinoma and cirrhosis.   Neck Pain Arm Pain       Home Medications Prior to Admission medications   Medication Sig Start Date End Date Taking? Authorizing Provider  predniSONE (STERAPRED UNI-PAK 21 TAB) 10 MG (21) TBPK tablet Take by mouth daily. Take 6 tabs by mouth daily  for 2 days, then 5 tabs for 2 days, then 4 tabs for 2 days, then 3 tabs for 2 days, 2 tabs for 2 days, then 1 tab by mouth daily for 2 days 07/03/22  Yes Aniyha Tate F, PA-C  Cyanocobalamin (VITAMIN B 12) 500 MCG TABS Take 500 mg by mouth 3 (three) times a week.     [provider]  levothyroxine (SYNTHROID) 50 MCG tablet Take 50 mcg by mouth daily.  12/23/19   [provider]  lidocaine (LIDODERM) 5 % Place 1 patch onto the skin daily.  Remove & Discard patch within 12 hours or as directed by MD 10/11/20   Ward, Delice Bison, DO  lisinopril (ZESTRIL) 10 MG tablet Take 10 mg by mouth daily. 12/23/19   [provider]  methocarbamol (ROBAXIN) 500 MG tablet Take 1 tablet (500 mg total) by mouth 2 (two) times daily. 05/20/20   Tedd Sias, PA  nadolol (CORGARD) 20 MG tablet Take 1 tablet (20 mg total) by mouth daily. 07/16/20 07/16/21  Mauri Pole, MD  ondansetron (ZOFRAN ODT) 4 MG disintegrating tablet '4mg'$  ODT q4 hours prn nausea/vomit 02/03/21   Deno Etienne, DO  oxyCODONE (ROXICODONE) 5 MG immediate release tablet Take 1 tablet (5 mg total) by mouth every 6 (six) hours as needed for severe pain. 10/11/20   Ward, Delice Bison, DO      Allergies    Patient has no known allergies.    Review of Systems   Review of Systems  Musculoskeletal:  Positive for arthralgias, myalgias and neck pain.  All other systems reviewed and are negative.   Physical Exam Updated Vital Signs BP 127/85 (BP Location: Right Arm)   Pulse 73   Temp 97.6 F (36.4 C) (Oral)   Resp 18   Ht '5\' 6"'$  (1.676 m)   Wt 56.7 kg   SpO2 96%   BMI 20.18 kg/m  Physical Exam Vitals and nursing note reviewed.  Constitutional:      General: He is not in acute distress.    Appearance: Normal appearance. He is not ill-appearing, toxic-appearing or diaphoretic.  HENT:     Head: Normocephalic and atraumatic.     Mouth/Throat:     Mouth: Mucous membranes are moist.     Pharynx: Oropharynx is clear.  Eyes:     Extraocular Movements: Extraocular movements intact.     Conjunctiva/sclera: Conjunctivae normal.     Pupils: Pupils are equal, round, and reactive to light.  Cardiovascular:     Rate and Rhythm: Normal rate and regular rhythm.  Pulmonary:     Effort: Pulmonary effort is normal.     Breath sounds: Normal breath sounds. No wheezing.  Abdominal:     General: Abdomen is flat. Bowel sounds are normal.     Palpations: Abdomen is soft.      Tenderness: There is no abdominal tenderness.  Musculoskeletal:     Cervical back: Rigidity and tenderness present.  Skin:    General: Skin is warm and dry.     Capillary Refill: Capillary refill takes less than 2 seconds.  Neurological:     General: No focal deficit present.     Mental Status: He is alert and oriented to person, place, and time.     GCS: GCS eye subscore is 4. GCS verbal subscore is 5. GCS motor subscore is 6.     Cranial Nerves: Cranial nerves 2-12 are intact. No cranial nerve deficit.     Sensory: Sensation is intact. No sensory deficit.     Motor: Motor function is intact. No weakness.     ED Results / Procedures / Treatments   Labs (all labs ordered are listed, but only abnormal results are displayed) Labs Reviewed  CBC - Abnormal; Notable for the following components:      Result Value   RBC 3.77 (*)    Hemoglobin 11.7 (*)    HCT 33.5 (*)    Platelets 107 (*)    All other components within normal limits  BASIC METABOLIC PANEL    EKG None  Radiology CT Head Wo Contrast  Result Date: 07/03/2022 CLINICAL DATA:  Altered mental status.  Unknown cause. EXAM: CT HEAD WITHOUT CONTRAST CT CERVICAL SPINE WITHOUT CONTRAST TECHNIQUE: Multidetector CT imaging of the head and cervical spine was performed following the standard protocol without intravenous contrast. Multiplanar CT image reconstructions of the cervical spine were also generated. RADIATION DOSE REDUCTION: This exam was performed according to the departmental dose-optimization program which includes automated exposure control, adjustment of the mA and/or kV according to patient size and/or use of iterative reconstruction technique. COMPARISON:  Cervical CT, 10/11/2020. FINDINGS: CT HEAD FINDINGS Brain: No evidence of acute infarction, hemorrhage, hydrocephalus, extra-axial collection or mass lesion/mass effect. There is ventricular sulcal enlargement consistent with mild diffuse atrophy. Patchy white matter  hypoattenuation is noted bilaterally consistent with moderate chronic microvascular ischemic change. Vascular: No hyperdense vessel or unexpected calcification. Skull: Normal. Negative for fracture or focal lesion. Sinuses/Orbits: Globes and orbits are unremarkable. Visualized sinuses are clear. Other: None. CT CERVICAL SPINE FINDINGS Alignment: Focal kyphosis, apex at C5.  No spondylolisthesis. Skull base and vertebrae: No acute fracture. No primary bone lesion or focal pathologic process. Soft tissues and spinal canal: No prevertebral fluid or swelling. No visible canal hematoma. Disc levels: Moderate loss of disc height at C3-C4, C5-C6 and C6-C7. Moderate to marked loss of disc height at C7-T1. Endplate cystic change, mild sclerosis and spurring, most  evident at C5-C6. No convincing disc herniation. Mild disc bulging. Facet degenerative changes bilaterally including facet joint widening on the left at C4-C5. Upper chest: No acute or significant abnormalities. Other: None. IMPRESSION: HEAD CT 1. No acute intracranial abnormalities. CERVICAL CT 1. No fracture or acute finding. 2. Significant degenerative changes as detailed, facet degenerative change progressing at C4-C5 when compared to the prior study. Electronically Signed   By: Lajean Manes M.D.   On: 07/03/2022 12:48   CT Cervical Spine Wo Contrast  Result Date: 07/03/2022 CLINICAL DATA:  Altered mental status.  Unknown cause. EXAM: CT HEAD WITHOUT CONTRAST CT CERVICAL SPINE WITHOUT CONTRAST TECHNIQUE: Multidetector CT imaging of the head and cervical spine was performed following the standard protocol without intravenous contrast. Multiplanar CT image reconstructions of the cervical spine were also generated. RADIATION DOSE REDUCTION: This exam was performed according to the departmental dose-optimization program which includes automated exposure control, adjustment of the mA and/or kV according to patient size and/or use of iterative reconstruction  technique. COMPARISON:  Cervical CT, 10/11/2020. FINDINGS: CT HEAD FINDINGS Brain: No evidence of acute infarction, hemorrhage, hydrocephalus, extra-axial collection or mass lesion/mass effect. There is ventricular sulcal enlargement consistent with mild diffuse atrophy. Patchy white matter hypoattenuation is noted bilaterally consistent with moderate chronic microvascular ischemic change. Vascular: No hyperdense vessel or unexpected calcification. Skull: Normal. Negative for fracture or focal lesion. Sinuses/Orbits: Globes and orbits are unremarkable. Visualized sinuses are clear. Other: None. CT CERVICAL SPINE FINDINGS Alignment: Focal kyphosis, apex at C5.  No spondylolisthesis. Skull base and vertebrae: No acute fracture. No primary bone lesion or focal pathologic process. Soft tissues and spinal canal: No prevertebral fluid or swelling. No visible canal hematoma. Disc levels: Moderate loss of disc height at C3-C4, C5-C6 and C6-C7. Moderate to marked loss of disc height at C7-T1. Endplate cystic change, mild sclerosis and spurring, most evident at C5-C6. No convincing disc herniation. Mild disc bulging. Facet degenerative changes bilaterally including facet joint widening on the left at C4-C5. Upper chest: No acute or significant abnormalities. Other: None. IMPRESSION: HEAD CT 1. No acute intracranial abnormalities. CERVICAL CT 1. No fracture or acute finding. 2. Significant degenerative changes as detailed, facet degenerative change progressing at C4-C5 when compared to the prior study. Electronically Signed   By: Lajean Manes M.D.   On: 07/03/2022 12:48    Procedures Procedures   Medications Ordered in ED Medications  HYDROmorphone (DILAUDID) injection 1 mg (1 mg Intravenous Given 07/03/22 1212)  dexamethasone (DECADRON) injection 10 mg (10 mg Intravenous Given 07/03/22 1212)  HYDROmorphone (DILAUDID) injection 0.5 mg (0.5 mg Intravenous Given 07/03/22 1359)    ED Course/ Medical Decision Making/  A&P                           Medical Decision Making Amount and/or Complexity of Data Reviewed Labs: ordered. Radiology: ordered.  Risk Prescription drug management.   71 year old male presents to ED for evaluation.  Please see HPI for further details.  On examination the patient is afebrile and nontachycardic.  Patient lung sounds are clear bilaterally, he is not hypoxic on room air.  The patient abdomen is soft and compressible.  The patient does have tenderness about the cervical spine however there is no overlying skin change, deformity or crepitus noted.  Chart review, patient appears to have foraminal stenosis in the cervical spinal area.  Patient family also complaining that the patient is becoming more forgetful, memory is not as  sharp.  The patient is alert and oriented x4.  Patient worked up utilizing the following labs and imaging studies interpreted by me personally: -BMP unremarkable -CBC unremarkable - CT head shows no intracranial abnormality - CT cervical spine shows increased degeneration at C4/C5, most likely cause of patient new onset pain  The patient was given 10 mg Decadron, 1 mg Dilaudid, 0.5 mg Dilaudid.  The patient reports that his pain is controlled at this time.  The patient is under pain management contract, I am unable to provide this patient a prescription for pain medication.  Patient was sent home with short course of steroids.  Patient at this time is stable for discharge.  The patient has been given return precautions and he is voiced understanding.  The patient has had all of his questions answered to satisfaction prior to discharge.  The patient is stable at operative.   Final Clinical Impression(s) / ED Diagnoses Final diagnoses:  Cervical radiculopathy    Rx / DC Orders ED Discharge Orders          Ordered    predniSONE (STERAPRED UNI-PAK 21 TAB) 10 MG (21) TBPK tablet  Daily        07/03/22 1432              Azucena Cecil, PA-C 07/03/22 1434    Varney Biles, MD 07/08/22 1449

## 2022-07-03 NOTE — ED Triage Notes (Signed)
Pt reports ongoing neck pain that radiates down his right arm. Hx of cervical radiculopathy. Reports he has been having some swelling in his right hand for the past couple of weeks as well.

## 2022-07-03 NOTE — Discharge Instructions (Addendum)
Please return to the ED with any new or worsening signs or symptom such as one-sided weakness or numbness Please follow-up with the patient's neurosurgeon for further management Please read attached guide concerning cervical radiculopathy, radicular pain Please begin taking steroids at home that I prescribed

## 2022-07-08 DIAGNOSIS — M25511 Pain in right shoulder: Secondary | ICD-10-CM | POA: Diagnosis not present

## 2022-07-08 DIAGNOSIS — M5030 Other cervical disc degeneration, unspecified cervical region: Secondary | ICD-10-CM | POA: Diagnosis not present

## 2022-07-08 DIAGNOSIS — M069 Rheumatoid arthritis, unspecified: Secondary | ICD-10-CM | POA: Diagnosis not present

## 2022-07-08 DIAGNOSIS — F1120 Opioid dependence, uncomplicated: Secondary | ICD-10-CM | POA: Diagnosis not present

## 2022-07-08 DIAGNOSIS — Z79891 Long term (current) use of opiate analgesic: Secondary | ICD-10-CM | POA: Diagnosis not present

## 2022-07-08 DIAGNOSIS — Z681 Body mass index (BMI) 19 or less, adult: Secondary | ICD-10-CM | POA: Diagnosis not present

## 2022-07-08 DIAGNOSIS — Z79899 Other long term (current) drug therapy: Secondary | ICD-10-CM | POA: Diagnosis not present

## 2022-08-05 DIAGNOSIS — M5030 Other cervical disc degeneration, unspecified cervical region: Secondary | ICD-10-CM | POA: Diagnosis not present

## 2022-08-05 DIAGNOSIS — M069 Rheumatoid arthritis, unspecified: Secondary | ICD-10-CM | POA: Diagnosis not present

## 2022-08-05 DIAGNOSIS — Z79899 Other long term (current) drug therapy: Secondary | ICD-10-CM | POA: Diagnosis not present

## 2022-08-05 DIAGNOSIS — M25511 Pain in right shoulder: Secondary | ICD-10-CM | POA: Diagnosis not present

## 2022-08-05 DIAGNOSIS — Z6829 Body mass index (BMI) 29.0-29.9, adult: Secondary | ICD-10-CM | POA: Diagnosis not present

## 2022-08-05 DIAGNOSIS — Z79891 Long term (current) use of opiate analgesic: Secondary | ICD-10-CM | POA: Diagnosis not present

## 2022-08-05 DIAGNOSIS — F1120 Opioid dependence, uncomplicated: Secondary | ICD-10-CM | POA: Diagnosis not present

## 2022-08-08 DIAGNOSIS — M47812 Spondylosis without myelopathy or radiculopathy, cervical region: Secondary | ICD-10-CM | POA: Diagnosis not present

## 2022-08-10 DIAGNOSIS — Z79899 Other long term (current) drug therapy: Secondary | ICD-10-CM | POA: Diagnosis not present

## 2022-08-20 ENCOUNTER — Emergency Department (HOSPITAL_COMMUNITY): Payer: Medicare HMO

## 2022-08-20 ENCOUNTER — Encounter (HOSPITAL_COMMUNITY): Payer: Self-pay

## 2022-08-20 ENCOUNTER — Inpatient Hospital Stay (HOSPITAL_COMMUNITY)
Admission: EM | Admit: 2022-08-20 | Discharge: 2022-08-22 | DRG: 689 | Disposition: A | Discharge status: Home / Self Care | Payer: Medicare HMO | Attending: Internal Medicine | Admitting: Internal Medicine

## 2022-08-20 ENCOUNTER — Other Ambulatory Visit: Payer: Self-pay

## 2022-08-20 DIAGNOSIS — K7460 Unspecified cirrhosis of liver: Secondary | ICD-10-CM | POA: Diagnosis present

## 2022-08-20 DIAGNOSIS — D61818 Other pancytopenia: Secondary | ICD-10-CM | POA: Diagnosis present

## 2022-08-20 DIAGNOSIS — Z8249 Family history of ischemic heart disease and other diseases of the circulatory system: Secondary | ICD-10-CM

## 2022-08-20 DIAGNOSIS — N390 Urinary tract infection, site not specified: Secondary | ICD-10-CM

## 2022-08-20 DIAGNOSIS — Z923 Personal history of irradiation: Secondary | ICD-10-CM

## 2022-08-20 DIAGNOSIS — Z043 Encounter for examination and observation following other accident: Secondary | ICD-10-CM | POA: Diagnosis not present

## 2022-08-20 DIAGNOSIS — Z833 Family history of diabetes mellitus: Secondary | ICD-10-CM | POA: Diagnosis not present

## 2022-08-20 DIAGNOSIS — N3000 Acute cystitis without hematuria: Principal | ICD-10-CM | POA: Diagnosis present

## 2022-08-20 DIAGNOSIS — C228 Malignant neoplasm of liver, primary, unspecified as to type: Secondary | ICD-10-CM

## 2022-08-20 DIAGNOSIS — G9341 Metabolic encephalopathy: Secondary | ICD-10-CM | POA: Diagnosis not present

## 2022-08-20 DIAGNOSIS — I8510 Secondary esophageal varices without bleeding: Secondary | ICD-10-CM | POA: Diagnosis not present

## 2022-08-20 DIAGNOSIS — R4182 Altered mental status, unspecified: Secondary | ICD-10-CM | POA: Diagnosis not present

## 2022-08-20 DIAGNOSIS — N179 Acute kidney failure, unspecified: Secondary | ICD-10-CM | POA: Diagnosis not present

## 2022-08-20 DIAGNOSIS — R410 Disorientation, unspecified: Secondary | ICD-10-CM | POA: Diagnosis not present

## 2022-08-20 DIAGNOSIS — F17210 Nicotine dependence, cigarettes, uncomplicated: Secondary | ICD-10-CM | POA: Diagnosis present

## 2022-08-20 DIAGNOSIS — K7030 Alcoholic cirrhosis of liver without ascites: Secondary | ICD-10-CM

## 2022-08-20 DIAGNOSIS — Z79899 Other long term (current) drug therapy: Secondary | ICD-10-CM

## 2022-08-20 DIAGNOSIS — B9620 Unspecified Escherichia coli [E. coli] as the cause of diseases classified elsewhere: Secondary | ICD-10-CM | POA: Diagnosis present

## 2022-08-20 DIAGNOSIS — G8929 Other chronic pain: Secondary | ICD-10-CM | POA: Diagnosis present

## 2022-08-20 DIAGNOSIS — Z8505 Personal history of malignant neoplasm of liver: Secondary | ICD-10-CM

## 2022-08-20 DIAGNOSIS — R296 Repeated falls: Secondary | ICD-10-CM | POA: Diagnosis not present

## 2022-08-20 DIAGNOSIS — I10 Essential (primary) hypertension: Secondary | ICD-10-CM | POA: Diagnosis present

## 2022-08-20 HISTORY — DX: Unspecified cirrhosis of liver: K74.60

## 2022-08-20 LAB — COMPREHENSIVE METABOLIC PANEL
ALT: 10 U/L (ref 0–44)
AST: 23 U/L (ref 15–41)
Albumin: 3.5 g/dL (ref 3.5–5.0)
Alkaline Phosphatase: 62 U/L (ref 38–126)
Anion gap: 9 (ref 5–15)
BUN: 37 mg/dL — ABNORMAL HIGH (ref 8–23)
CO2: 21 mmol/L — ABNORMAL LOW (ref 22–32)
Calcium: 9.2 mg/dL (ref 8.9–10.3)
Chloride: 108 mmol/L (ref 98–111)
Creatinine, Ser: 1.26 mg/dL — ABNORMAL HIGH (ref 0.61–1.24)
GFR, Estimated: >60 mL/min (ref >60)
Glucose, Bld: 109 mg/dL — ABNORMAL HIGH (ref 70–99)
Potassium: 4.3 mmol/L (ref 3.5–5.1)
Sodium: 138 mmol/L (ref 135–145)
Total Bilirubin: 0.5 mg/dL (ref 0.3–1.2)
Total Protein: 7.4 g/dL (ref 6.5–8.1)

## 2022-08-20 LAB — I-STAT CHEM 8, ED
BUN: 34 mg/dL — ABNORMAL HIGH (ref 8–23)
Calcium, Ion: 1.14 mmol/L — ABNORMAL LOW (ref 1.15–1.40)
Chloride: 106 mmol/L (ref 98–111)
Creatinine, Ser: 1.3 mg/dL — ABNORMAL HIGH (ref 0.61–1.24)
Glucose, Bld: 104 mg/dL — ABNORMAL HIGH (ref 70–99)
HCT: 30 % — ABNORMAL LOW (ref 39.0–52.0)
Hemoglobin: 10.2 g/dL — ABNORMAL LOW (ref 13.0–17.0)
Potassium: 4.3 mmol/L (ref 3.5–5.1)
Sodium: 140 mmol/L (ref 135–145)
TCO2: 23 mmol/L (ref 22–32)

## 2022-08-20 LAB — CBC WITH DIFFERENTIAL/PLATELET
Abs Immature Granulocytes: 0.02 10*3/uL (ref 0.00–0.07)
Basophils Absolute: 0 10*3/uL (ref 0.0–0.1)
Basophils Relative: 0 %
Eosinophils Absolute: 0 10*3/uL (ref 0.0–0.5)
Eosinophils Relative: 1 %
HCT: 33.1 % — ABNORMAL LOW (ref 39.0–52.0)
Hemoglobin: 11.1 g/dL — ABNORMAL LOW (ref 13.0–17.0)
Immature Granulocytes: 0 %
Lymphocytes Relative: 22 %
Lymphs Abs: 1.2 10*3/uL (ref 0.7–4.0)
MCH: 31.2 pg (ref 26.0–34.0)
MCHC: 33.5 g/dL (ref 30.0–36.0)
MCV: 93 fL (ref 80.0–100.0)
Monocytes Absolute: 0.3 10*3/uL (ref 0.1–1.0)
Monocytes Relative: 5 %
Neutro Abs: 4 10*3/uL (ref 1.7–7.7)
Neutrophils Relative %: 72 %
Platelets: 94 10*3/uL — ABNORMAL LOW (ref 150–400)
RBC: 3.56 MIL/uL — ABNORMAL LOW (ref 4.22–5.81)
RDW: 13.6 % (ref 11.5–15.5)
WBC: 5.5 10*3/uL (ref 4.0–10.5)
nRBC: 0 % (ref 0.0–0.2)

## 2022-08-20 LAB — RAPID URINE DRUG SCREEN, HOSP PERFORMED
Amphetamines: NOT DETECTED
Barbiturates: NOT DETECTED
Benzodiazepines: POSITIVE — AB
Cocaine: NOT DETECTED
Opiates: POSITIVE — AB
Tetrahydrocannabinol: POSITIVE — AB

## 2022-08-20 LAB — URINALYSIS, ROUTINE W REFLEX MICROSCOPIC
Bilirubin Urine: NEGATIVE
Glucose, UA: NEGATIVE mg/dL
Hgb urine dipstick: NEGATIVE
Ketones, ur: NEGATIVE mg/dL
Nitrite: NEGATIVE
Protein, ur: NEGATIVE mg/dL
Specific Gravity, Urine: 1.018 (ref 1.005–1.030)
WBC, UA: >50 WBC/hpf — ABNORMAL HIGH (ref 0–5)
pH: 5 (ref 5.0–8.0)

## 2022-08-20 LAB — LACTIC ACID, PLASMA
Lactic Acid, Venous: 1.8 mmol/L (ref 0.5–1.9)
Lactic Acid, Venous: 1.5 mmol/L (ref 0.5–1.9)

## 2022-08-20 LAB — BLOOD GAS, VENOUS
Acid-base deficit: 0.3 mmol/L (ref 0.0–2.0)
Bicarbonate: 26.3 mmol/L (ref 20.0–28.0)
O2 Saturation: 29.9 %
Patient temperature: 37
pCO2, Ven: 51 mmHg (ref 44–60)
pH, Ven: 7.32 (ref 7.25–7.43)
pO2, Ven: <31 mmHg — CL (ref 32–45)

## 2022-08-20 LAB — TSH: TSH: 2.046 u[IU]/mL (ref 0.350–4.500)

## 2022-08-20 LAB — AMMONIA: Ammonia: 17 umol/L (ref 9–35)

## 2022-08-20 LAB — PROTIME-INR
INR: 1.1 (ref 0.8–1.2)
Prothrombin Time: 14.5 seconds (ref 11.4–15.2)

## 2022-08-20 LAB — LIPASE, BLOOD: Lipase: 26 U/L (ref 11–51)

## 2022-08-20 LAB — ETHANOL: Alcohol, Ethyl (B): <10 mg/dL (ref <10)

## 2022-08-20 MED ORDER — SODIUM CHLORIDE 0.9 % IV BOLUS
1000.0000 mL | Freq: Once | INTRAVENOUS | Status: AC
  Administered 2022-08-20: 1000 mL via INTRAVENOUS

## 2022-08-20 MED ORDER — ENOXAPARIN SODIUM 40 MG/0.4ML IJ SOSY
40.0000 mg | PREFILLED_SYRINGE | INTRAMUSCULAR | Status: DC
  Administered 2022-08-21 – 2022-08-22 (×2): 40 mg via SUBCUTANEOUS
  Filled 2022-08-20 (×2): qty 0.4

## 2022-08-20 MED ORDER — OXYCODONE HCL 5 MG PO TABS
15.0000 mg | ORAL_TABLET | ORAL | Status: DC | PRN
  Administered 2022-08-21: 15 mg via ORAL
  Filled 2022-08-20: qty 3

## 2022-08-20 MED ORDER — SODIUM CHLORIDE 0.9 % IV SOLN
2.0000 g | Freq: Once | INTRAVENOUS | Status: AC
  Administered 2022-08-20: 2 g via INTRAVENOUS
  Filled 2022-08-20: qty 20

## 2022-08-20 MED ORDER — SODIUM CHLORIDE 0.9 % IV BOLUS
500.0000 mL | Freq: Once | INTRAVENOUS | Status: AC
  Administered 2022-08-20: 500 mL via INTRAVENOUS

## 2022-08-20 MED ORDER — SODIUM CHLORIDE 0.9 % IV SOLN
1.0000 g | INTRAVENOUS | Status: DC
  Administered 2022-08-21 – 2022-08-22 (×2): 1 g via INTRAVENOUS
  Filled 2022-08-20 (×2): qty 10

## 2022-08-20 MED ORDER — GABAPENTIN 300 MG PO CAPS
300.0000 mg | ORAL_CAPSULE | Freq: Three times a day (TID) | ORAL | Status: DC
  Administered 2022-08-21 – 2022-08-22 (×4): 300 mg via ORAL
  Filled 2022-08-20 (×4): qty 1

## 2022-08-20 NOTE — ED Triage Notes (Signed)
Patient's daughter reports that the patient has cirrhosis of the liver. Patient has had altered mental status today. Patient has had a total of 7 falls today, trying to  drink from a small trash can, confused to situations at home.

## 2022-08-20 NOTE — Assessment & Plan Note (Signed)
History of alcoholic and treated HCV. -Currently compensated without ascites - Follows with [facility] hepatology.  Not currently on transplant list since he was found to have nicotine and THC in urine studies before.  Patient also opted to remove himself from transplant list.

## 2022-08-20 NOTE — H&P (Signed)
History and Physical    Patient: Fernando Ryan MPN:[id number] DOB: 1951-05-18 DOA: 08/20/2022 DOS: the patient was seen and examined on 08/20/2022 PCP: Antionette Fairy, PA-C (Inactive)  Patient coming from: Home  Chief Complaint:  Chief Complaint  Patient presents with   Altered Mental Status   HPI: Fernando Ryan is a 71 y.o. 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  History obtained from daughter over the phone.  He was alert and oriented to only to self. Daughter reports confusion and unsteady gait started 3 days ago. Midnight had acutely worsening confusion and could no longer stand up without assistance. [place] about 7 times in past 12 hours. Tried to drink out of a trash can and disoriented about location. Has nausea but no vomiting. No diarrhea or constipation that daughter knows of. Has been complaining of neck pain but that is chronic due to degenerative changes.  Daughter helps him with medication and did not have any concerns about overdose. Took old prescription of Ativan a few days ago to sleep.  Takes CBD gummies for pain.  No alcohol use.  Has never had encephalopathy with his cirrhosis.  In the ED, he was afebrile and normotensive on room air.  No leukocytosis, hemoglobin 11.1, chronic thrombocytopenia of 94.  Elevated creatinine at 1.30.  Ammonia level of 17 EtOH of less than 10 UDS positive for benzo, opioid and THC  UA is positive for large leukocyte, negative nitrite many bacteria and greater than 50 WBC.  CT head and cervical spine were negative for any fractures or acute findings.  Negative chest x-ray.  Negative pelvic x-ray.  He was given IV fluids and started on IV Rocephin for UTI in the ED.  Hospitalist then consulted for  admission. Review of Systems: unable to review all systems due to the inability of the patient to answer questions. Past Medical History:  Diagnosis Date   Cirrhosis ([place])    Hepatitis    Hep C   Hypertension    Past Surgical History:  Procedure Laterality Date   COLONOSCOPY WITH PROPOFOL N/A 01/07/2020   Procedure: COLONOSCOPY WITH PROPOFOL;  Surgeon: Mauri Pole, MD;  Location: [facility];  Service: Endoscopy;  Laterality: N/A;   ESOPHAGOGASTRODUODENOSCOPY (EGD) WITH PROPOFOL N/A 01/07/2020   Procedure: ESOPHAGOGASTRODUODENOSCOPY (EGD) WITH PROPOFOL;  Surgeon: Mauri Pole, MD;  Location: [facility];  Service: Endoscopy;  Laterality: N/A;   FEMUR FRACTURE SURGERY     HEMOSTASIS CLIP PLACEMENT  01/07/2020   Procedure: HEMOSTASIS CLIP PLACEMENT;  Surgeon: Mauri Pole, MD;  Location: [facility];  Service: Endoscopy;;   IR RADIOLOGIST EVAL & MGMT  01/28/2020   IR RADIOLOGIST EVAL & MGMT  04/09/2020   IR RADIOLOGIST EVAL & MGMT  08/26/2020   IR RADIOLOGIST EVAL & MGMT  06/08/2021   IR RADIOLOGIST EVAL & MGMT  12/03/2021   POLYPECTOMY  01/07/2020   Procedure: POLYPECTOMY;  Surgeon: Mauri Pole, MD;  Location: [facility];  Service: Endoscopy;;   RADIOLOGY WITH ANESTHESIA N/A 03/04/2020   Procedure: CT WITH ANESTHESIA  MICROWAVE ABLATION;  Surgeon: Jacqulynn Cadet, MD;  Location: [facility];  Service: Anesthesiology;  Laterality: N/A;   Social History:  reports that he has been smoking cigarettes. He has a 4.50 pack-year smoking history. He has never used smokeless tobacco. He reports that he does not  currently use alcohol. He reports that he does not currently use drugs.  No Known Allergies  Family History  Problem Relation Age of Onset   Diabetes Mother    Heart disease Mother    Heart disease Father    CAD Brother    Colon cancer Neg Hx    Esophageal cancer Neg Hx    Pancreatic cancer Neg Hx    Stomach cancer Neg Hx     Prior to Admission  medications   Medication Sig Start Date End Date Taking? Authorizing Provider  Calcium Carb-Cholecalciferol (CALCIUM + D3 PO) Take 1 tablet by mouth daily.   Yes [provider]  gabapentin (NEURONTIN) 300 MG capsule Take 300 mg by mouth 3 (three) times daily. 10/18/21  Yes [provider]  ondansetron (ZOFRAN ODT) 4 MG disintegrating tablet '4mg'$  ODT q4 hours prn nausea/vomit Patient taking differently: Take 4 mg by mouth every 4 (four) hours as needed for nausea or vomiting (DISSOLVE ORALLY). 02/03/21  Yes Deno Etienne, DO  oxyCODONE (ROXICODONE) 15 MG immediate release tablet Take 15-22.5 mg by mouth every 4 (four) hours.   Yes [provider]  lidocaine (LIDODERM) 5 % Place 1 patch onto the skin daily. Remove & Discard patch within 12 hours or as directed by MD Patient not taking: Reported on 08/20/2022 10/11/20   Ward, Delice Bison, DO  methocarbamol (ROBAXIN) 500 MG tablet Take 1 tablet (500 mg total) by mouth 2 (two) times daily. Patient not taking: Reported on 08/20/2022 05/20/20   Tedd Sias, PA  nadolol (CORGARD) 20 MG tablet Take 1 tablet (20 mg total) by mouth daily. Patient not taking: Reported on 08/20/2022 07/16/20 08/20/22  Mauri Pole, MD  oxyCODONE (ROXICODONE) 5 MG immediate release tablet Take 1 tablet (5 mg total) by mouth every 6 (six) hours as needed for severe pain. Patient not taking: Reported on 08/20/2022 10/11/20   Ward, Delice Bison, DO  predniSONE (STERAPRED UNI-PAK 21 TAB) 10 MG (21) TBPK tablet Take by mouth daily. Take 6 tabs by mouth daily  for 2 days, then 5 tabs for 2 days, then 4 tabs for 2 days, then 3 tabs for 2 days, 2 tabs for 2 days, then 1 tab by mouth daily for 2 days Patient not taking: Reported on 08/20/2022 07/03/22   Azucena Cecil, PA-C    Physical Exam: Vitals:   08/20/22 1927 08/20/22 2030 08/20/22 2043 08/20/22 2257  BP: (!) 105/59 107/64  126/71  Pulse: 75 70  68  Resp: '13 12  18  '$ Temp:   98 F (36.7 C) 98.2 F  (36.8 C)  TempSrc:   Oral Oral  SpO2: 98% 95%  100%  Weight:      Height:       Constitutional: NAD, calm, comfortable nontoxic-appearing thin elderly male and laying flat in bed Eyes: lids and conjunctivae normal ENMT: Mucous membranes are moist.  Neck: normal, supple Respiratory: clear to auscultation bilaterally, no wheezing, no crackles. Normal respiratory effort. No accessory muscle use.  Cardiovascular: Regular rate and rhythm, no murmurs / rubs / gallops. No extremity edema.   Abdomen: Soft, nondistended, no tenderness.  Bowel sounds positive.  Musculoskeletal: no clubbing / cyanosis. No joint deformity upper and lower extremities. Good ROM, no contractures.  Muscle wasting on all 4 extremities.   Skin: no rashes, lesions, ulcers.  Neurologic: CN 2-12 grossly intact. Strength 5/5 in all 4.  Weak bilateral handgrip.  No asterixis.  Alert and oriented only to self,  thinks it is 2003 and that he is in [place].  However he was able to get the correct president's name. Psychiatric: Alert and oriented only to self.  Normal mood. Data Reviewed:  See HPI  Assessment and Plan: * AMS (altered mental status) Likely secondary to UTI. -Ammonia level is within normal limits.  CT head is negative without any focal neurological findings.  Cirrhosis appears to be compensated. -Continue IV Rocephin pending urine culture  AKI (acute kidney injury) ([place]) Creatinine mildly elevated 1.30.  Has been given IV fluid boluses in the ED.  Will monitor repeat in the morning without additional fluids at this time given his cirrhosis.  Cirrhosis ([place]) History of alcoholic and treated HCV. -Currently compensated without ascites - Follows with [facility].  Not currently on transplant list since he was found to have nicotine and THC in urine studies before.  Patient also opted to remove himself from transplant list.  hepatocellular carcinoma S/p ablation in remission.  Follows closely with  [facility] for surveillance.      Advance Care Planning:   Code Status: Full Code-confirmed with daughter  Consults: None  Family Communication: With daughter over the phone  Severity of Illness: The appropriate patient status for this patient is OBSERVATION. Observation status is judged to be reasonable and necessary in order to provide the required intensity of service to ensure the patient's safety. The patient's presenting symptoms, physical exam findings, and initial radiographic and laboratory data in the context of their medical condition is felt to place them at decreased risk for further clinical deterioration. Furthermore, it is anticipated that the patient will be medically stable for discharge from the hospital within 2 midnights of admission.   Author: Orene Desanctis, DO 08/20/2022 11:40 PM  For on call review [email or web address].

## 2022-08-20 NOTE — ED Notes (Signed)
Patient transported to CT 

## 2022-08-20 NOTE — ED Provider Notes (Signed)
[facility] DEPT Provider Note   CSN: [id number] Arrival date & time: 08/20/22  1614    History  Chief Complaint  Patient presents with   Altered Mental Status    Fernando Ryan is a 71 y.o. male known cirrhosis, HCC, hypertension, chronic neck pain, hepatitis C, esophageal varices, chronic neckpainhere for evaluation of altered mental status.  Daughter noted when she saw patient he seemed confused.  Did not even know his house.  Apparently had multiple falls today.  She found patient trying to drink from a small trash can at home.  States he is only on gabapentin and oxycodone.  She states he had taken himself off his other medications at some point in time.  He is followed by the liver clinic, [facility].  She called them and they are concerned about hepatic encephalopathy recommended coming here for evaluation.  Patient has been fine up until today when she saw him this morning.  No recent illnesses.  Tolerating p.o. intake.  Patient does not take lactulose per her daughter  HPI     Home Medications Prior to Admission medications   Medication Sig Start Date End Date Taking? Authorizing Provider  Calcium Carb-Cholecalciferol (CALCIUM + D3 PO) Take 1 tablet by mouth daily.   Yes [provider]  gabapentin (NEURONTIN) 300 MG capsule Take 300 mg by mouth 3 (three) times daily. 10/18/21  Yes [provider]  ondansetron (ZOFRAN ODT) 4 MG disintegrating tablet '4mg'$  ODT q4 hours prn nausea/vomit Patient taking differently: Take 4 mg by mouth every 4 (four) hours as needed for nausea or vomiting (DISSOLVE ORALLY). 02/03/21  Yes Deno Etienne, DO  oxyCODONE (ROXICODONE) 15 MG immediate release tablet Take 15-22.5 mg by mouth every 4 (four) hours.   Yes [provider]  lidocaine (LIDODERM) 5 % Place 1 patch onto the skin daily. Remove & Discard patch within 12 hours or as directed by MD Patient not taking: Reported on  08/20/2022 10/11/20   Ward, Delice Bison, DO  methocarbamol (ROBAXIN) 500 MG tablet Take 1 tablet (500 mg total) by mouth 2 (two) times daily. Patient not taking: Reported on 08/20/2022 05/20/20   Tedd Sias, PA  nadolol (CORGARD) 20 MG tablet Take 1 tablet (20 mg total) by mouth daily. Patient not taking: Reported on 08/20/2022 07/16/20 08/20/22  Mauri Pole, MD  oxyCODONE (ROXICODONE) 5 MG immediate release tablet Take 1 tablet (5 mg total) by mouth every 6 (six) hours as needed for severe pain. Patient not taking: Reported on 08/20/2022 10/11/20   Ward, Delice Bison, DO  predniSONE (STERAPRED UNI-PAK 21 TAB) 10 MG (21) TBPK tablet Take by mouth daily. Take 6 tabs by mouth daily  for 2 days, then 5 tabs for 2 days, then 4 tabs for 2 days, then 3 tabs for 2 days, 2 tabs for 2 days, then 1 tab by mouth daily for 2 days Patient not taking: Reported on 08/20/2022 07/03/22   Azucena Cecil, PA-C      Allergies    Patient has no known allergies.    Review of Systems   Review of Systems  Unable to perform ROS: Mental status change  All other systems reviewed and are negative.   Physical Exam Updated Vital Signs BP 126/71 (BP Location: Right Arm)   Pulse 68   Temp 98.2 F (36.8 C) (Oral)   Resp 18   Ht '5\' 4"'$  (1.626 m)   Wt 57.2 kg  SpO2 100%   BMI 21.63 kg/m  Physical Exam Vitals and nursing note reviewed.  Constitutional:      General: He is not in acute distress.    Appearance: He is well-developed. He is ill-appearing. He is not diaphoretic.     Comments: Thin, ill-appearing, jaundiced, appears confused  HENT:     Head: Atraumatic.     Nose: Nose normal.     Mouth/Throat:     Mouth: Mucous membranes are dry.     Comments: Dry mucous membranes Eyes:     Pupils: Pupils are equal, round, and reactive to light.  Cardiovascular:     Rate and Rhythm: Normal rate and regular rhythm.     Pulses: Normal pulses.     Heart sounds: Normal heart sounds.  Pulmonary:     Effort:  Pulmonary effort is normal. No respiratory distress.     Breath sounds: Normal breath sounds.     Comments: Clear bil, speaks without difficulty Abdominal:     General: Bowel sounds are normal. There is no distension.     Palpations: Abdomen is soft.     Tenderness: There is no abdominal tenderness. There is no guarding or rebound.  Musculoskeletal:        General: Normal range of motion.     Cervical back: Normal range of motion and neck supple.     Comments: Moves all extremities without difficulty  Skin:    General: Skin is warm and dry.  Neurological:     Mental Status: He is alert. He is disoriented and confused.     Motor: Weakness present.     Comments: Alert, confused.  Knows name, not place, year.  No facial droop.  Does follow commands however gross weakness in all 4 extremities.     ED Results / Procedures / Treatments   Labs (all labs ordered are listed, but only abnormal results are displayed) Labs Reviewed  CBC WITH DIFFERENTIAL/PLATELET - Abnormal; Notable for the following components:      Result Value   RBC 3.56 (*)    Hemoglobin 11.1 (*)    HCT 33.1 (*)    Platelets 94 (*)    All other components within normal limits  COMPREHENSIVE METABOLIC PANEL - Abnormal; Notable for the following components:   CO2 21 (*)    Glucose, Bld 109 (*)    BUN 37 (*)    Creatinine, Ser 1.26 (*)    All other components within normal limits  BLOOD GAS, VENOUS - Abnormal; Notable for the following components:   pO2, Ven <31 (*)    All other components within normal limits  URINALYSIS, ROUTINE W REFLEX MICROSCOPIC - Abnormal; Notable for the following components:   APPearance HAZY (*)    Leukocytes,Ua LARGE (*)    WBC, UA >50 (*)    Bacteria, UA MANY (*)    All other components within normal limits  RAPID URINE DRUG SCREEN, HOSP PERFORMED - Abnormal; Notable for the following components:   Opiates POSITIVE (*)    Benzodiazepines POSITIVE (*)    Tetrahydrocannabinol POSITIVE  (*)    All other components within normal limits  I-STAT CHEM 8, ED - Abnormal; Notable for the following components:   BUN 34 (*)    Creatinine, Ser 1.30 (*)    Glucose, Bld 104 (*)    Calcium, Ion 1.14 (*)    Hemoglobin 10.2 (*)    HCT 30.0 (*)    All other components within normal limits  CULTURE, BLOOD (ROUTINE X 2)  CULTURE, BLOOD (ROUTINE X 2)  URINE CULTURE  LIPASE, BLOOD  ETHANOL  AMMONIA  PROTIME-INR  LACTIC ACID, PLASMA  LACTIC ACID, PLASMA  TSH    EKG EKG Interpretation  Date/Time:  Saturday August 20 2022 17:19:40 EDT Ventricular Rate:  66 PR Interval:  161 QRS Duration: 98 QT Interval:  385 QTC Calculation: 404 R Axis:   16 Text Interpretation: Sinus rhythm Confirmed by Nanda Quinton [phone]) on 08/20/2022 5:22:03 PM  Radiology CT HEAD WO CONTRAST (5MM)  Result Date: 08/20/2022 CLINICAL DATA:  Fall.  Altered mental status. EXAM: CT HEAD WITHOUT CONTRAST CT CERVICAL SPINE WITHOUT CONTRAST TECHNIQUE: Multidetector CT imaging of the head and cervical spine was performed following the standard protocol without intravenous contrast. Multiplanar CT image reconstructions of the cervical spine were also generated. RADIATION DOSE REDUCTION: This exam was performed according to the departmental dose-optimization program which includes automated exposure control, adjustment of the mA and/or kV according to patient size and/or use of iterative reconstruction technique. COMPARISON:  07/03/2022. FINDINGS: CT HEAD FINDINGS Brain: No evidence of acute infarction, hemorrhage, hydrocephalus, extra-axial collection or mass lesion/mass effect. Ventricular sulcal enlargement reflecting mild atrophy. Patchy areas of white matter hypoattenuation consistent with moderate chronic microvascular ischemic change. Vascular: No hyperdense vessel or unexpected calcification. Skull: Normal. Negative for fracture or focal lesion. Sinuses/Orbits: Globes and orbits are unremarkable. Visualized sinuses  are clear. Other: None. CT CERVICAL SPINE FINDINGS Alignment: Reversal the normal cervical lordosis, apex at C5. No spondylolisthesis. Skull base and vertebrae: No acute fracture. No primary bone lesion or focal pathologic process. Soft tissues and spinal canal: No prevertebral fluid or swelling. No visible canal hematoma. Disc levels: Mild loss of disc height at C2-C3. Moderate loss of disc height at C3-C4. Moderate loss of disc height at C5-C6 and C6-C7. Marked loss of disc height at C7-T1. Mild disc bulging. No convincing disc herniation. Bilateral facet degenerative changes. Upper chest: No acute findings. Other: None. IMPRESSION: HEAD CT 1. No acute intracranial abnormalities. CERVICAL CT 1. No fracture or acute finding. Electronically Signed   By: Lajean Manes M.D.   On: 08/20/2022 17:14   CT Cervical Spine Wo Contrast  Result Date: 08/20/2022 CLINICAL DATA:  Fall.  Altered mental status. EXAM: CT HEAD WITHOUT CONTRAST CT CERVICAL SPINE WITHOUT CONTRAST TECHNIQUE: Multidetector CT imaging of the head and cervical spine was performed following the standard protocol without intravenous contrast. Multiplanar CT image reconstructions of the cervical spine were also generated. RADIATION DOSE REDUCTION: This exam was performed according to the departmental dose-optimization program which includes automated exposure control, adjustment of the mA and/or kV according to patient size and/or use of iterative reconstruction technique. COMPARISON:  07/03/2022. FINDINGS: CT HEAD FINDINGS Brain: No evidence of acute infarction, hemorrhage, hydrocephalus, extra-axial collection or mass lesion/mass effect. Ventricular sulcal enlargement reflecting mild atrophy. Patchy areas of white matter hypoattenuation consistent with moderate chronic microvascular ischemic change. Vascular: No hyperdense vessel or unexpected calcification. Skull: Normal. Negative for fracture or focal lesion. Sinuses/Orbits: Globes and orbits are  unremarkable. Visualized sinuses are clear. Other: None. CT CERVICAL SPINE FINDINGS Alignment: Reversal the normal cervical lordosis, apex at C5. No spondylolisthesis. Skull base and vertebrae: No acute fracture. No primary bone lesion or focal pathologic process. Soft tissues and spinal canal: No prevertebral fluid or swelling. No visible canal hematoma. Disc levels: Mild loss of disc height at C2-C3. Moderate loss of disc height at C3-C4. Moderate loss of disc height at C5-C6 and C6-C7. Marked  loss of disc height at C7-T1. Mild disc bulging. No convincing disc herniation. Bilateral facet degenerative changes. Upper chest: No acute findings. Other: None. IMPRESSION: HEAD CT 1. No acute intracranial abnormalities. CERVICAL CT 1. No fracture or acute finding. Electronically Signed   By: Lajean Manes M.D.   On: 08/20/2022 17:14   DG Pelvis 1-2 Views  Result Date: 08/20/2022 CLINICAL DATA:  Multiple falls and altered mental status. EXAM: PELVIS - 1 VIEW COMPARISON:  06/16/2022 CT of the abdomen and pelvis. FINDINGS: There is no evidence of pelvic fracture or diastasis. No pelvic bone lesions are seen. Mild degenerative changes in the RIGHT hip are noted. Intramedullary nail/screw within the proximal RIGHT femur noted. IMPRESSION: No evidence of acute abnormality. Electronically Signed   By: Margarette Canada M.D.   On: 08/20/2022 17:01   DG Chest 2 View  Result Date: 08/20/2022 CLINICAL DATA:  Altered mental status, history of cirrhosis EXAM: CHEST - 2 VIEW COMPARISON:  Chest x-ray May 20, 2020 FINDINGS: The cardiomediastinal silhouette is normal in contour. No focal pulmonary opacity. No pleural effusion. No pneumothorax. Visualized abdomen is unremarkable. No acute osseous abnormality noted. IMPRESSION: No acute cardiopulmonary abnormality. Electronically Signed   By: Beryle Flock M.D.   On: 08/20/2022 16:59    Procedures Procedures    Medications Ordered in ED Medications  sodium chloride 0.9 % bolus  500 mL (500 mLs Intravenous New Bag/Given 08/20/22 1823)  cefTRIAXone (ROCEPHIN) 2 g in sodium chloride 0.9 % 100 mL IVPB (2 g Intravenous New Bag/Given 08/20/22 1923)  sodium chloride 0.9 % bolus 1,000 mL (1,000 mLs Intravenous New Bag/Given 08/20/22 1925)    ED Course/ Medical Decision Making/ A&P    71 year old known cirrhosis, HCC, hep C here for evaluation of altered mental status.  Does take chronic opiates at home, family not concerned about intentional overdose.  Daughter when she saw patient today noted that he did not know the home he lives in, was try to drink out of a trash can.  Patient appears confused in room.  He does follow commands however he is globally weak.  He has no focal deficits.  He is unsure where he is at or what year it is.  He does appear to have jaundice. No Asterixis.  I did review his note, his daughter called his liver clinic where he was seen in the [place] office however is an [facility] clinic.  They stated this was possible hepatic encephalopathy per their note patient can be admitted here did not need transfer to [facility]  He has had multiple falls.  We will plan on imaging.  Labs and imaging personally viewed and interpreted:  CT head without significant abnormality CT cervical without significant abnormality Xray chest without pneumonia, pneumothorax, cardiomegaly, pulmonary edema Xray pelvis no acute fracture CBC without leukocytosis, hemoglobin 7.1, baseline CMP creatinine 1.26, up from baseline, patient getting IV fluids Ethanol less than 10 Lipase 26 Ammonia 17 Lactic 1.5 UDS positive for opiates, benzos, THC TSH 2.046 INR 1.1 EKG without ischemic changes  Patient reassessed.  Still confused.  Pending further work-up.  Getting IV fluids.  Patient reassessed.  He has been able to give [place] urine sample, dark, malodorous  Patient reassessed.  Urinalysis positive for infection.  Getting Rocephin.  Will send for culture.  This is likely the  source of his altered mental status.  Discussed results with patient's daughter via telephone.  She is agreeable for admission.  Consult Dr. Flossie Buffy with [facility] who  agrees to evaluate patient for admission.  The patient appears reasonably stabilized for admission considering the current resources, flow, and capabilities available in the ED at this time, and I doubt any other [facility] requiring further screening and/or treatment in the ED prior to admission.                          Medical Decision Making Amount and/or Complexity of Data Reviewed Independent Historian:     Details: Daughter External Data Reviewed: labs, radiology, ECG and notes. Labs: ordered. Decision-making details documented in ED Course. Radiology: ordered and independent interpretation performed. Decision-making details documented in ED Course. ECG/medicine tests: ordered and independent interpretation performed. Decision-making details documented in ED Course.  Risk Decision regarding hospitalization.          Final Clinical Impression(s) / ED Diagnoses Final diagnoses:  Altered mental status, unspecified altered mental status type  Acute cystitis without hematuria  AKI (acute kidney injury) [facility])    Rx / DC Orders ED Discharge Orders     None         Authur Cubit A, PA-C 08/20/22 2305    Milton Ferguson, MD 08/22/22 1223

## 2022-08-20 NOTE — Assessment & Plan Note (Signed)
S/p ablation in remission.  Follows closely with [facility] for surveillance.

## 2022-08-20 NOTE — Assessment & Plan Note (Signed)
Creatinine mildly elevated 1.30.  Has been given IV fluid boluses in the ED.  Will monitor repeat in the morning without additional fluids at this time given his cirrhosis.

## 2022-08-20 NOTE — Assessment & Plan Note (Signed)
Likely secondary to UTI. -Ammonia level is within normal limits.  CT head is negative without any focal neurological findings.  Cirrhosis appears to be compensated. -Continue IV Rocephin pending urine culture -On exam today patient is much less confused

## 2022-08-21 DIAGNOSIS — K7030 Alcoholic cirrhosis of liver without ascites: Secondary | ICD-10-CM | POA: Diagnosis not present

## 2022-08-21 DIAGNOSIS — F17210 Nicotine dependence, cigarettes, uncomplicated: Secondary | ICD-10-CM | POA: Diagnosis present

## 2022-08-21 DIAGNOSIS — Z923 Personal history of irradiation: Secondary | ICD-10-CM | POA: Diagnosis not present

## 2022-08-21 DIAGNOSIS — G8929 Other chronic pain: Secondary | ICD-10-CM | POA: Diagnosis present

## 2022-08-21 DIAGNOSIS — Z833 Family history of diabetes mellitus: Secondary | ICD-10-CM | POA: Diagnosis not present

## 2022-08-21 DIAGNOSIS — G9341 Metabolic encephalopathy: Secondary | ICD-10-CM | POA: Diagnosis present

## 2022-08-21 DIAGNOSIS — I8510 Secondary esophageal varices without bleeding: Secondary | ICD-10-CM | POA: Diagnosis present

## 2022-08-21 DIAGNOSIS — K7460 Unspecified cirrhosis of liver: Secondary | ICD-10-CM | POA: Diagnosis present

## 2022-08-21 DIAGNOSIS — Z79899 Other long term (current) drug therapy: Secondary | ICD-10-CM | POA: Diagnosis not present

## 2022-08-21 DIAGNOSIS — R410 Disorientation, unspecified: Secondary | ICD-10-CM | POA: Diagnosis not present

## 2022-08-21 DIAGNOSIS — D61818 Other pancytopenia: Secondary | ICD-10-CM | POA: Diagnosis present

## 2022-08-21 DIAGNOSIS — Z8249 Family history of ischemic heart disease and other diseases of the circulatory system: Secondary | ICD-10-CM | POA: Diagnosis not present

## 2022-08-21 DIAGNOSIS — I10 Essential (primary) hypertension: Secondary | ICD-10-CM | POA: Diagnosis present

## 2022-08-21 DIAGNOSIS — N179 Acute kidney failure, unspecified: Secondary | ICD-10-CM | POA: Diagnosis present

## 2022-08-21 DIAGNOSIS — N3000 Acute cystitis without hematuria: Secondary | ICD-10-CM | POA: Diagnosis present

## 2022-08-21 DIAGNOSIS — Z8505 Personal history of malignant neoplasm of liver: Secondary | ICD-10-CM | POA: Diagnosis not present

## 2022-08-21 DIAGNOSIS — B9620 Unspecified Escherichia coli [E. coli] as the cause of diseases classified elsewhere: Secondary | ICD-10-CM | POA: Diagnosis present

## 2022-08-21 LAB — CBC
HCT: 26.1 % — ABNORMAL LOW (ref 39.0–52.0)
Hemoglobin: 8.9 g/dL — ABNORMAL LOW (ref 13.0–17.0)
MCH: 31.2 pg (ref 26.0–34.0)
MCHC: 34.1 g/dL (ref 30.0–36.0)
MCV: 91.6 fL (ref 80.0–100.0)
Platelets: 65 10*3/uL — ABNORMAL LOW (ref 150–400)
RBC: 2.85 MIL/uL — ABNORMAL LOW (ref 4.22–5.81)
RDW: 13.4 % (ref 11.5–15.5)
WBC: 3 10*3/uL — ABNORMAL LOW (ref 4.0–10.5)
nRBC: 0 % (ref 0.0–0.2)

## 2022-08-21 LAB — BASIC METABOLIC PANEL
Anion gap: 7 (ref 5–15)
BUN: 31 mg/dL — ABNORMAL HIGH (ref 8–23)
CO2: 22 mmol/L (ref 22–32)
Calcium: 8.9 mg/dL (ref 8.9–10.3)
Chloride: 111 mmol/L (ref 98–111)
Creatinine, Ser: 0.91 mg/dL (ref 0.61–1.24)
GFR, Estimated: >60 mL/min (ref >60)
Glucose, Bld: 94 mg/dL (ref 70–99)
Potassium: 3.5 mmol/L (ref 3.5–5.1)
Sodium: 140 mmol/L (ref 135–145)

## 2022-08-21 MED ORDER — DICLOFENAC SODIUM 1 % EX GEL
4.0000 g | Freq: Four times a day (QID) | CUTANEOUS | Status: DC
  Administered 2022-08-21 – 2022-08-22 (×2): 4 g via TOPICAL
  Filled 2022-08-21: qty 100

## 2022-08-21 MED ORDER — SODIUM CHLORIDE 0.9 % IV SOLN: INTRAVENOUS | Status: DC | PRN

## 2022-08-21 NOTE — Assessment & Plan Note (Signed)
On Rocephin Urine culture pending Patient reports multiple nighttime awakenings for nocturia

## 2022-08-21 NOTE — Hospital Course (Signed)
Patient with PMH of cirrhosis secondary to hepatitis C, esophageal varices and hepatocellular carcinoma status post radioablation, and chronic cervical radicular pain on chronic opioids and hypertension who presented with altered mental status. Patient lives with his daughter who reported increasing confusion unsteady gait approximately 7 falls in the last 12 hours and marked confusion.  In the ED was noted to have possible urinary tract infection.

## 2022-08-21 NOTE — Evaluation (Signed)
Physical Therapy Evaluation Patient Details Name: Fernando Ryan MRN: [id number] DOB: October 26, 1951 Today's Date: 08/21/2022  History of Present Illness  Pt admitted from home after "falling 7 times before lunch" with AMS - likely 2* UTI.  Pt with hx of ETOH abuse, hep C, Cirrhosis, hepato cellular carcinoma and chronic cervical radicular pain  Clinical Impression  Pt admitted as above and presenting with functional mobility limitations 2* generalized weakness and ambulatory balance deficits.  Pt up to ambulate in halls and reports improvement in strength/stability since hospital admit but states and appears steadier with use of RW at this time.  Pt should progress to dc home with family assist.     Recommendations for follow up therapy are one component of a multi-disciplinary discharge planning process, led by the attending physician.  Recommendations may be updated based on patient status, additional functional criteria and insurance authorization.  Follow Up Recommendations No PT follow up      Assistance Recommended at Discharge Intermittent Supervision/Assistance  Patient can return home with the following  A little help with walking and/or transfers;A little help with bathing/dressing/bathroom;Assistance with cooking/housework;Assist for transportation;Help with stairs or ramp for entrance    Equipment Recommendations Rolling walker (2 wheels)  Recommendations for Other Services       Functional Status Assessment Patient has had a recent decline in their functional status and demonstrates the ability to make significant improvements in function in a reasonable and predictable amount of time.     Precautions / Restrictions Precautions Precautions: Fall Restrictions Weight Bearing Restrictions: No      Mobility  Bed Mobility Overal bed mobility: Modified Independent             General bed mobility comments: no physical assist    Transfers Overall transfer level:  Needs assistance Equipment used: Rolling walker (2 wheels) Transfers: Sit to/from Stand Sit to Stand: Min guard           General transfer comment: Steady assist and cues for use of UEs to self assist    Ambulation/Gait Ambulation/Gait assistance: Min assist, Min guard Gait Distance (Feet): 400 Feet Assistive device: None, Rolling walker (2 wheels), Straight cane Gait Pattern/deviations: Step-through pattern, Decreased step length - right, Decreased step length - left, Shuffle, Wide base of support       General Gait Details: Noted instability sans AD but no overt LOB, partial improvement with use of cane and supervision level with RW.  Pt states he feels safest with RW  Stairs            Wheelchair Mobility    Modified Rankin (Stroke Patients Only)       Balance Overall balance assessment: Needs assistance Sitting-balance support: No upper extremity supported, Feet supported Sitting balance-Leahy Scale: Good     Standing balance support: No upper extremity supported Standing balance-Leahy Scale: Fair                               Pertinent Vitals/Pain Pain Assessment Pain Assessment: No/denies pain    Home Living Family/patient expects to be discharged to:: Private residence Living Arrangements: Children Available Help at Discharge: Family;Available 24 hours/day Type of Home: House Home Access: Stairs to enter Entrance Stairs-Rails: None (no rails at front but has rails at back) Entrance Stairs-Number of Steps: 3   Home Layout: One level Home Equipment: Cane - single point      Prior Function Prior Level of Function :  Independent/Modified Independent             Mobility Comments: Prior to recent hx of falls, pt reports occasional use of cane       Hand Dominance        Extremity/Trunk Assessment   Upper Extremity Assessment Upper Extremity Assessment: Generalized weakness    Lower Extremity Assessment Lower Extremity  Assessment: Generalized weakness       Communication   Communication: No difficulties  Cognition Arousal/Alertness: Awake/alert Behavior During Therapy: Impulsive, WFL for tasks assessed/performed Overall Cognitive Status: Impaired/Different from baseline                                 General Comments: Pt oriented to person and time with increased thought but keeps insisting he is happy they did NOT bring him to [facility]        General Comments      Exercises     Assessment/Plan    PT Assessment Patient needs continued PT services  PT Problem List Decreased strength;Decreased balance;Decreased mobility;Decreased knowledge of use of DME       PT Treatment Interventions DME instruction;Gait training;Stair training;Functional mobility training;Therapeutic activities;Therapeutic exercise;Balance training;Patient/family education    PT Goals (Current goals can be found in the Care Plan section)  Acute Rehab PT Goals Patient Stated Goal: HOME PT Goal Formulation: With patient Time For Goal Achievement: 09/04/22 Potential to Achieve Goals: Good    Frequency Min 3X/week     Co-evaluation               AM-PAC PT "6 Clicks" Mobility  Outcome Measure Help needed turning from your back to your side while in a flat bed without using bedrails?: None Help needed moving from lying on your back to sitting on the side of a flat bed without using bedrails?: None Help needed moving to and from a bed to a chair (including a wheelchair)?: A Little Help needed standing up from a chair using your arms (e.g., wheelchair or bedside chair)?: A Little Help needed to walk in hospital room?: A Little Help needed climbing 3-5 steps with a railing? : A Little 6 Click Score: 20    End of Session Equipment Utilized During Treatment: Gait belt Activity Tolerance: Patient tolerated treatment well Patient left: in chair;with chair alarm set;with family/visitor  present Nurse Communication: Mobility status PT Visit Diagnosis: Difficulty in walking, not elsewhere classified (R26.2);Muscle weakness (generalized) (M62.81)    Time: 4401-0272 PT Time Calculation (min) (ACUTE ONLY): 23 min   Charges:   PT Evaluation $PT Eval Low Complexity: 1 Low          [place] Pager [phone] Office [phone]   Fernando Ryan 08/21/2022, 2:54 PM

## 2022-08-21 NOTE — Progress Notes (Signed)
Progress Note   Patient: Fernando Ryan LOV:[id number] DOB: 1951/10/03 DOA: 08/20/2022     0 DOS: the patient was seen and examined on 08/21/2022   Brief hospital course: No notes on file  Assessment and Plan: * AMS (altered mental status) Likely secondary to UTI. -Ammonia level is within normal limits.  CT head is negative without any focal neurological findings.  Cirrhosis appears to be compensated. -Continue IV Rocephin pending urine culture -On exam today patient is much less confused  AKI (acute kidney injury) ([place]) Creatinine mildly elevated 1.30 on admission down to 0.91 today avoid nephrotoxic agents  Trend  UTI (urinary tract infection) On Rocephin Urine culture pending Patient reports multiple nighttime awakenings for nocturia  Cirrhosis ([place]) History of alcoholic and treated HCV. -Currently compensated without ascites - Follows with [facility] hepatology.  Not currently on transplant list since he was found to have nicotine and THC in urine studies before.  Patient also opted to remove himself from transplant list.  hepatocellular carcinoma S/p ablation in remission.  Follows closely with [facility] for surveillance.    Subjective: Patient is awake and alert today.  He is wanting to eat.  He denies any significant pain.  Physical Exam: Vitals:   08/20/22 2043 08/20/22 2257 08/21/22 0539 08/21/22 0841  BP:  126/71 127/69 (!) 151/94  Pulse:  68 65 67  Resp:  '18 18 18  '$ Temp: 98 F (36.7 C) 98.2 F (36.8 C) 97.6 F (36.4 C) 97.9 F (36.6 C)  TempSrc: Oral Oral Oral Oral  SpO2:  100% 99% 100%  Weight:      Height:       Physical Examination: General appearance - alert, well appearing, and in no distress Chest - clear to auscultation, no wheezes, rales or rhonchi, symmetric air entry Heart - normal rate, regular rhythm, normal S1, S2, no murmurs, rubs, clicks or gallops Abdomen - soft, nontender, nondistended, no masses or organomegaly Extremities - peripheral  pulses normal, no pedal edema, no clubbing or cyanosis  Data Reviewed: Results for orders placed or performed during the hospital encounter of 08/20/22 (from the past 24 hour(s))  CBC with Differential     Status: Abnormal   Collection Time: 08/20/22  5:32 PM  Result Value Ref Range   WBC 5.5 4.0 - 10.5 K/uL   RBC 3.56 (L) 4.22 - 5.81 MIL/uL   Hemoglobin 11.1 (L) 13.0 - 17.0 g/dL   HCT 33.1 (L) 39.0 - 52.0 %   MCV 93.0 80.0 - 100.0 fL   MCH 31.2 26.0 - 34.0 pg   MCHC 33.5 30.0 - 36.0 g/dL   RDW 13.6 11.5 - 15.5 %   Platelets 94 (L) 150 - 400 K/uL   nRBC 0.0 0.0 - 0.2 %   Neutrophils Relative % 72 %   Neutro Abs 4.0 1.7 - 7.7 K/uL   Lymphocytes Relative 22 %   Lymphs Abs 1.2 0.7 - 4.0 K/uL   Monocytes Relative 5 %   Monocytes Absolute 0.3 0.1 - 1.0 K/uL   Eosinophils Relative 1 %   Eosinophils Absolute 0.0 0.0 - 0.5 K/uL   Basophils Relative 0 %   Basophils Absolute 0.0 0.0 - 0.1 K/uL   Immature Granulocytes 0 %   Abs Immature Granulocytes 0.02 0.00 - 0.07 K/uL  Comprehensive metabolic panel     Status: Abnormal   Collection Time: 08/20/22  5:32 PM  Result Value Ref Range   Sodium 138 135 - 145 mmol/L   Potassium 4.3 3.5 -  5.1 mmol/L   Chloride 108 98 - 111 mmol/L   CO2 21 (L) 22 - 32 mmol/L   Glucose, Bld 109 (H) 70 - 99 mg/dL   BUN 37 (H) 8 - 23 mg/dL   Creatinine, Ser 1.26 (H) 0.61 - 1.24 mg/dL   Calcium 9.2 8.9 - 10.3 mg/dL   Total Protein 7.4 6.5 - 8.1 g/dL   Albumin 3.5 3.5 - 5.0 g/dL   AST 23 15 - 41 U/L   ALT 10 0 - 44 U/L   Alkaline Phosphatase 62 38 - 126 U/L   Total Bilirubin 0.5 0.3 - 1.2 mg/dL   GFR, Estimated >60 >60 mL/min   Anion gap 9 5 - 15  Lipase, blood     Status: None   Collection Time: 08/20/22  5:32 PM  Result Value Ref Range   Lipase 26 11 - 51 U/L  Ethanol     Status: None   Collection Time: 08/20/22  5:32 PM  Result Value Ref Range   Alcohol, Ethyl (B) <10 <10 mg/dL  Ammonia     Status: None   Collection Time: 08/20/22  5:32 PM   Result Value Ref Range   Ammonia 17 9 - 35 umol/L  Protime-INR     Status: None   Collection Time: 08/20/22  5:32 PM  Result Value Ref Range   Prothrombin Time 14.5 11.4 - 15.2 seconds   INR 1.1 0.8 - 1.2  Lactic acid, plasma     Status: None   Collection Time: 08/20/22  5:32 PM  Result Value Ref Range   Lactic Acid, Venous 1.8 0.5 - 1.9 mmol/L  Blood culture (routine x 2)     Status: None (Preliminary result)   Collection Time: 08/20/22  5:32 PM   Specimen: BLOOD  Result Value Ref Range   Specimen Description      BLOOD BLOOD LEFT FOREARM Performed at [facility], [place] [address], [place], [place] [address]    Special Requests      BOTTLES DRAWN AEROBIC AND ANAEROBIC Blood Culture results may not be optimal due to an excessive volume of blood received in culture bottles Performed at [place] [address], [place], [place] [address]    Culture      NO GROWTH < 12 HOURS Performed at [facility] [address], [place], [place] [address]    Report Status PENDING   TSH     Status: None   Collection Time: 08/20/22  5:32 PM  Result Value Ref Range   TSH 2.046 0.350 - 4.500 uIU/mL  Blood gas, venous (at [facility] and [facility], not at [facility])     Status: Abnormal   Collection Time: 08/20/22  5:48 PM  Result Value Ref Range   pH, Ven 7.32 7.25 - 7.43   pCO2, Ven 51 44 - 60 mmHg   pO2, Ven <31 (LL) 32 - 45 mmHg   Bicarbonate 26.3 20.0 - 28.0 mmol/L   Acid-base deficit 0.3 0.0 - 2.0 mmol/L   O2 Saturation 29.9 %   Patient temperature 37.0   Blood culture (routine x 2)     Status: None (Preliminary result)   Collection Time: 08/20/22  5:48 PM   Specimen: BLOOD  Result Value Ref Range   Specimen Description      BLOOD LEFT ANTECUBITAL Performed at [facility] [address] [place], [place] [address]    Special Requests      BOTTLES  DRAWN AEROBIC AND ANAEROBIC Blood Culture results may not be optimal due to an  excessive volume of blood received in culture bottles Performed at [facility], [place] [address], [place], [place] [address]    Culture      NO GROWTH < 12 HOURS Performed at [facility] [address], [place], [place] [address]    Report Status PENDING   I-stat chem 8, ED (not at [facility] or [facility])     Status: Abnormal   Collection Time: 08/20/22  5:55 PM  Result Value Ref Range   Sodium 140 135 - 145 mmol/L   Potassium 4.3 3.5 - 5.1 mmol/L   Chloride 106 98 - 111 mmol/L   BUN 34 (H) 8 - 23 mg/dL   Creatinine, Ser 1.30 (H) 0.61 - 1.24 mg/dL   Glucose, Bld 104 (H) 70 - 99 mg/dL   Calcium, Ion 1.14 (L) 1.15 - 1.40 mmol/L   TCO2 23 22 - 32 mmol/L   Hemoglobin 10.2 (L) 13.0 - 17.0 g/dL   HCT 30.0 (L) 39.0 - 52.0 %  Urinalysis, Routine w reflex microscopic Urine, Clean Catch     Status: Abnormal   Collection Time: 08/20/22  6:46 PM  Result Value Ref Range   Color, Urine YELLOW YELLOW   APPearance HAZY (A) CLEAR   Specific Gravity, Urine 1.018 1.005 - 1.030   pH 5.0 5.0 - 8.0   Glucose, UA NEGATIVE NEGATIVE mg/dL   Hgb urine dipstick NEGATIVE NEGATIVE   Bilirubin Urine NEGATIVE NEGATIVE   Ketones, ur NEGATIVE NEGATIVE mg/dL   Protein, ur NEGATIVE NEGATIVE mg/dL   Nitrite NEGATIVE NEGATIVE   Leukocytes,Ua LARGE (A) NEGATIVE   RBC / HPF 11-20 0 - 5 RBC/hpf   WBC, UA >50 (H) 0 - 5 WBC/hpf   Bacteria, UA MANY (A) NONE SEEN   Squamous Epithelial / LPF 0-5 0 - 5   WBC Clumps PRESENT    Mucus PRESENT    Hyaline Casts, UA PRESENT   Rapid urine drug screen (hospital performed)     Status: Abnormal   Collection Time: 08/20/22  6:46 PM  Result Value Ref Range   Opiates POSITIVE (A) NONE DETECTED   Cocaine NONE DETECTED NONE DETECTED   Benzodiazepines POSITIVE (A) NONE DETECTED   Amphetamines NONE DETECTED NONE DETECTED   Tetrahydrocannabinol POSITIVE (A) NONE DETECTED   Barbiturates NONE DETECTED NONE DETECTED  Lactic acid, plasma     Status: None   Collection  Time: 08/20/22  7:23 PM  Result Value Ref Range   Lactic Acid, Venous 1.5 0.5 - 1.9 mmol/L  CBC     Status: Abnormal   Collection Time: 08/21/22  4:59 AM  Result Value Ref Range   WBC 3.0 (L) 4.0 - 10.5 K/uL   RBC 2.85 (L) 4.22 - 5.81 MIL/uL   Hemoglobin 8.9 (L) 13.0 - 17.0 g/dL   HCT 26.1 (L) 39.0 - 52.0 %   MCV 91.6 80.0 - 100.0 fL   MCH 31.2 26.0 - 34.0 pg   MCHC 34.1 30.0 - 36.0 g/dL   RDW 13.4 11.5 - 15.5 %   Platelets 65 (L) 150 - 400 K/uL   nRBC 0.0 0.0 - 0.2 %  Basic metabolic panel     Status: Abnormal   Collection Time: 08/21/22  4:59 AM  Result Value Ref Range   Sodium 140 135 - 145 mmol/L   Potassium 3.5 3.5 - 5.1 mmol/L   Chloride 111 98 - 111 mmol/L  CO2 22 22 - 32 mmol/L   Glucose, Bld 94 70 - 99 mg/dL   BUN 31 (H) 8 - 23 mg/dL   Creatinine, Ser 0.91 0.61 - 1.24 mg/dL   Calcium 8.9 8.9 - 10.3 mg/dL   GFR, Estimated >60 >60 mL/min   Anion gap 7 5 - 15    Family Communication: Patient at bedside  Disposition: Status is: Observation The patient will require care spanning > 2 midnights and should be moved to inpatient because: He remains on IV antibiotics.  He has an unsafe discharge plan.  Planned Discharge Destination: Home DVT prophylaxis: Lovenox Time spent: 36 minutes  Author: Donnamae Jude, MD 08/21/2022 11:13 AM  For on call review [email or web address].

## 2022-08-22 DIAGNOSIS — R410 Disorientation, unspecified: Secondary | ICD-10-CM | POA: Diagnosis not present

## 2022-08-22 LAB — CBC WITH DIFFERENTIAL/PLATELET
Abs Immature Granulocytes: 0.01 10*3/uL (ref 0.00–0.07)
Basophils Absolute: 0 10*3/uL (ref 0.0–0.1)
Basophils Relative: 0 %
Eosinophils Absolute: 0.1 10*3/uL (ref 0.0–0.5)
Eosinophils Relative: 4 %
HCT: 26.1 % — ABNORMAL LOW (ref 39.0–52.0)
Hemoglobin: 9 g/dL — ABNORMAL LOW (ref 13.0–17.0)
Immature Granulocytes: 0 %
Lymphocytes Relative: 42 %
Lymphs Abs: 1.1 10*3/uL (ref 0.7–4.0)
MCH: 31 pg (ref 26.0–34.0)
MCHC: 34.5 g/dL (ref 30.0–36.0)
MCV: 90 fL (ref 80.0–100.0)
Monocytes Absolute: 0.2 10*3/uL (ref 0.1–1.0)
Monocytes Relative: 6 %
Neutro Abs: 1.2 10*3/uL — ABNORMAL LOW (ref 1.7–7.7)
Neutrophils Relative %: 48 %
Platelets: 61 10*3/uL — ABNORMAL LOW (ref 150–400)
RBC: 2.9 MIL/uL — ABNORMAL LOW (ref 4.22–5.81)
RDW: 13.2 % (ref 11.5–15.5)
WBC: 2.5 10*3/uL — ABNORMAL LOW (ref 4.0–10.5)
nRBC: 0 % (ref 0.0–0.2)

## 2022-08-22 LAB — COMPREHENSIVE METABOLIC PANEL
ALT: 9 U/L (ref 0–44)
AST: 16 U/L (ref 15–41)
Albumin: 2.9 g/dL — ABNORMAL LOW (ref 3.5–5.0)
Alkaline Phosphatase: 54 U/L (ref 38–126)
Anion gap: 5 (ref 5–15)
BUN: 21 mg/dL (ref 8–23)
CO2: 23 mmol/L (ref 22–32)
Calcium: 8.9 mg/dL (ref 8.9–10.3)
Chloride: 113 mmol/L — ABNORMAL HIGH (ref 98–111)
Creatinine, Ser: 0.69 mg/dL (ref 0.61–1.24)
GFR, Estimated: >60 mL/min (ref >60)
Glucose, Bld: 92 mg/dL (ref 70–99)
Potassium: 3.7 mmol/L (ref 3.5–5.1)
Sodium: 141 mmol/L (ref 135–145)
Total Bilirubin: 0.4 mg/dL (ref 0.3–1.2)
Total Protein: 6.2 g/dL — ABNORMAL LOW (ref 6.5–8.1)

## 2022-08-22 LAB — MAGNESIUM: Magnesium: 1.8 mg/dL (ref 1.7–2.4)

## 2022-08-22 MED ORDER — CEPHALEXIN 500 MG PO CAPS: 500.0000 mg | ORAL_CAPSULE | Freq: Two times a day (BID) | ORAL | 0 refills | Status: AC

## 2022-08-22 NOTE — Discharge Summary (Signed)
Physician Discharge Summary  Fernando Ryan ZYS:[id number] DOB: 1951/03/21 DOA: 08/20/2022  PCP: Antionette Fairy, PA-C (Inactive)  Admit date: 08/20/2022 Discharge date: 08/22/2022  Admitted From: home Disposition:  home  Recommendations for Outpatient Follow-up:  Follow up with PCP in 1-2 weeks Follow up on final urine cultures  Home Health: none Equipment/Devices: none  Discharge Condition: stable CODE STATUS: Full code  HPI: Per admitting MD, Fernando Ryan is a 71 y.o. 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 History obtained from daughter over the phone.  He was alert and oriented to only to self. Daughter reports confusion and unsteady gait started 3 days ago. Midnight had acutely worsening confusion and could no longer stand up without assistance. [place] about 7 times in past 12 hours. Tried to drink out of a trash can and disoriented about location. Has nausea but no vomiting. No diarrhea or constipation that daughter knows of. Has been complaining of neck pain but that is chronic due to degenerative changes. Daughter helps him with medication and did not have any concerns about overdose. Took old prescription of Ativan a few days ago to sleep.  Takes CBD gummies for pain.  No alcohol use.  Has never had encephalopathy with his cirrhosis.  Hospital Course / Discharge diagnoses: Principal Problem:   AMS (altered mental status) Active Problems:   hepatocellular carcinoma   Cirrhosis ([place])   UTI (urinary tract infection)   AKI (acute kidney injury) ([place])  Principal problem Acute metabolic encephalopathy due to urinary tract infection-patient was found to have a UTI, was placed on antibiotics with significant improvement in his mental status.  Urine cultures  speciated E. coli.  Patient is currently alert and oriented x4, ambulating in the hallway, asking to go home.  He does not have a history of drug-resistant E. coli, and was narrowed to Keflex for 5 additional days to complete a 7-day course.  Active problems Acute kidney injury-resolved with fluids Liver cirrhosis-past EtOH use, quit 2 years ago.  Also had HCV, treated.  Appears compensated HCC-status post ablation, in remission Pancytopenia-due to cirrhosis.  Counts stable overall, no bleeding  Sepsis ruled out   Discharge Instructions   Allergies as of 08/22/2022   No Known Allergies      Medication List     STOP taking these medications    lidocaine 5 % Commonly known as: Lidoderm   methocarbamol 500 MG tablet Commonly known as: ROBAXIN   nadolol 20 MG tablet Commonly known as: Corgard   predniSONE 10 MG (21) Tbpk tablet Commonly known as: STERAPRED UNI-PAK 21 TAB       TAKE these medications    CALCIUM + D3 PO Take 1 tablet by mouth daily.   cephALEXin 500 MG capsule Commonly known as: KEFLEX Take 1 capsule (500 mg total) by mouth 2 (two) times daily for 5 days.   gabapentin 300 MG capsule Commonly known as: NEURONTIN Take 300 mg by mouth 3 (three) times daily.   ondansetron 4 MG disintegrating tablet Commonly known as: Zofran ODT '4mg'$  ODT q4 hours prn nausea/vomit What changed:  how much to take how to take this when to take this reasons to take this additional instructions   oxyCODONE 15 MG immediate release tablet Commonly known as: ROXICODONE Take 15-22.5 mg by mouth every 4 (four) hours. What changed: Another  medication with the same name was removed. Continue taking this medication, and follow the directions you see here.               Durable Medical Equipment  (From admission, onward)           Start     Ordered   08/21/22 1503  For home use only DME Walker rolling  Once       Question Answer Comment  Walker: With 5 Inch  Wheels   Patient needs a walker to treat with the following condition Difficulty in walking, not elsewhere classified      08/21/22 1503             Consultations:   Procedures/Studies:  CT HEAD WO CONTRAST (5MM)  Result Date: 08/20/2022 CLINICAL DATA:  Fall.  Altered mental status. EXAM: CT HEAD WITHOUT CONTRAST CT CERVICAL SPINE WITHOUT CONTRAST TECHNIQUE: Multidetector CT imaging of the head and cervical spine was performed following the standard protocol without intravenous contrast. Multiplanar CT image reconstructions of the cervical spine were also generated. RADIATION DOSE REDUCTION: This exam was performed according to the departmental dose-optimization program which includes automated exposure control, adjustment of the mA and/or kV according to patient size and/or use of iterative reconstruction technique. COMPARISON:  07/03/2022. FINDINGS: CT HEAD FINDINGS Brain: No evidence of acute infarction, hemorrhage, hydrocephalus, extra-axial collection or mass lesion/mass effect. Ventricular sulcal enlargement reflecting mild atrophy. Patchy areas of white matter hypoattenuation consistent with moderate chronic microvascular ischemic change. Vascular: No hyperdense vessel or unexpected calcification. Skull: Normal. Negative for fracture or focal lesion. Sinuses/Orbits: Globes and orbits are unremarkable. Visualized sinuses are clear. Other: None. CT CERVICAL SPINE FINDINGS Alignment: Reversal the normal cervical lordosis, apex at C5. No spondylolisthesis. Skull base and vertebrae: No acute fracture. No primary bone lesion or focal pathologic process. Soft tissues and spinal canal: No prevertebral fluid or swelling. No visible canal hematoma. Disc levels: Mild loss of disc height at C2-C3. Moderate loss of disc height at C3-C4. Moderate loss of disc height at C5-C6 and C6-C7. Marked loss of disc height at C7-T1. Mild disc bulging. No convincing disc herniation. Bilateral facet degenerative  changes. Upper chest: No acute findings. Other: None. IMPRESSION: HEAD CT 1. No acute intracranial abnormalities. CERVICAL CT 1. No fracture or acute finding. Electronically Signed   By: Lajean Manes M.D.   On: 08/20/2022 17:14   CT Cervical Spine Wo Contrast  Result Date: 08/20/2022 CLINICAL DATA:  Fall.  Altered mental status. EXAM: CT HEAD WITHOUT CONTRAST CT CERVICAL SPINE WITHOUT CONTRAST TECHNIQUE: Multidetector CT imaging of the head and cervical spine was performed following the standard protocol without intravenous contrast. Multiplanar CT image reconstructions of the cervical spine were also generated. RADIATION DOSE REDUCTION: This exam was performed according to the departmental dose-optimization program which includes automated exposure control, adjustment of the mA and/or kV according to patient size and/or use of iterative reconstruction technique. COMPARISON:  07/03/2022. FINDINGS: CT HEAD FINDINGS Brain: No evidence of acute infarction, hemorrhage, hydrocephalus, extra-axial collection or mass lesion/mass effect. Ventricular sulcal enlargement reflecting mild atrophy. Patchy areas of white matter hypoattenuation consistent with moderate chronic microvascular ischemic change. Vascular: No hyperdense vessel or unexpected calcification. Skull: Normal. Negative for fracture or focal lesion. Sinuses/Orbits: Globes and orbits are unremarkable. Visualized sinuses are clear. Other: None. CT CERVICAL SPINE FINDINGS Alignment: Reversal the normal cervical lordosis, apex at C5. No spondylolisthesis. Skull base and vertebrae: No acute fracture. No primary bone lesion or focal pathologic process.  Soft tissues and spinal canal: No prevertebral fluid or swelling. No visible canal hematoma. Disc levels: Mild loss of disc height at C2-C3. Moderate loss of disc height at C3-C4. Moderate loss of disc height at C5-C6 and C6-C7. Marked loss of disc height at C7-T1. Mild disc bulging. No convincing disc herniation.  Bilateral facet degenerative changes. Upper chest: No acute findings. Other: None. IMPRESSION: HEAD CT 1. No acute intracranial abnormalities. CERVICAL CT 1. No fracture or acute finding. Electronically Signed   By: Lajean Manes M.D.   On: 08/20/2022 17:14   DG Pelvis 1-2 Views  Result Date: 08/20/2022 CLINICAL DATA:  Multiple falls and altered mental status. EXAM: PELVIS - 1 VIEW COMPARISON:  06/16/2022 CT of the abdomen and pelvis. FINDINGS: There is no evidence of pelvic fracture or diastasis. No pelvic bone lesions are seen. Mild degenerative changes in the RIGHT hip are noted. Intramedullary nail/screw within the proximal RIGHT femur noted. IMPRESSION: No evidence of acute abnormality. Electronically Signed   By: Margarette Canada M.D.   On: 08/20/2022 17:01   DG Chest 2 View  Result Date: 08/20/2022 CLINICAL DATA:  Altered mental status, history of cirrhosis EXAM: CHEST - 2 VIEW COMPARISON:  Chest x-ray May 20, 2020 FINDINGS: The cardiomediastinal silhouette is normal in contour. No focal pulmonary opacity. No pleural effusion. No pneumothorax. Visualized abdomen is unremarkable. No acute osseous abnormality noted. IMPRESSION: No acute cardiopulmonary abnormality. Electronically Signed   By: Beryle Flock M.D.   On: 08/20/2022 16:59     Subjective: - no chest pain, shortness of breath, no abdominal pain, nausea or vomiting.   Discharge Exam: BP (!) 156/85 (BP Location: Right Arm)   Pulse 78   Temp 98 F (36.7 C)   Resp 16   Ht '5\' 4"'$  (1.626 m)   Wt 57.2 kg   SpO2 100%   BMI 21.63 kg/m   General: Pt is alert, awake, not in acute distress Cardiovascular: RRR, S1/S2 +, no rubs, no gallops Respiratory: CTA bilaterally, no wheezing, no rhonchi Abdominal: Soft, NT, ND, bowel sounds + Extremities: no edema, no cyanosis    The results of significant diagnostics from this hospitalization (including imaging, microbiology, ancillary and laboratory) are listed below for reference.      Microbiology: Recent Results (from the past 240 hour(s))  Blood culture (routine x 2)     Status: None (Preliminary result)   Collection Time: 08/20/22  5:32 PM   Specimen: BLOOD  Result Value Ref Range Status   Specimen Description   Final    BLOOD BLOOD LEFT FOREARM Performed at [facility] [address] [place], [place] [address]    Special Requests   Final    BOTTLES DRAWN AEROBIC AND ANAEROBIC Blood Culture results may not be optimal due to an excessive volume of blood received in culture bottles Performed at [facility] [address] [place], [place] [address]    Culture   Final    NO GROWTH < 12 HOURS Performed at [facility] [address] [place], [place] [address]    Report Status PENDING  Incomplete  Blood culture (routine x 2)     Status: None (Preliminary result)   Collection Time: 08/20/22  5:48 PM   Specimen: BLOOD  Result Value Ref Range Status   Specimen Description   Final    BLOOD LEFT ANTECUBITAL Performed at [facility] [address] [place], [place] [address]    Special Requests  Final    BOTTLES DRAWN AEROBIC AND ANAEROBIC Blood Culture results may not be optimal due to an excessive volume of blood received in culture bottles Performed at [facility] [address] [place], [place] [address]    Culture   Final    NO GROWTH < 12 HOURS Performed at [facility] [address], [place], [place] [address]    Report Status PENDING  Incomplete  Urine Culture     Status: Abnormal (Preliminary result)   Collection Time: 08/20/22 10:38 PM   Specimen: Urine, Clean Catch  Result Value Ref Range Status   Specimen Description   Final    URINE, CLEAN CATCH Performed at [facility], [place] [address] [place], [place] [address]    Special Requests   Final    NONE Performed at [facility], [place] [address],  [place], [place] [address]    Culture (A)  Final    >=100,000 COLONIES/mL ESCHERICHIA COLI SUSCEPTIBILITIES TO FOLLOW Performed at [facility], [place] [address] [place], [place] [address]    Report Status PENDING  Incomplete     Labs: Basic Metabolic Panel: Recent Labs  Lab 08/20/22 1732 08/20/22 1755 08/21/22 0459 08/22/22 0405  NA 138 140 140 141  K 4.3 4.3 3.5 3.7  CL 108 106 111 113*  CO2 21*  --  22 23  GLUCOSE 109* 104* 94 92  BUN 37* 34* 31* 21  CREATININE 1.26* 1.30* 0.91 0.69  CALCIUM 9.2  --  8.9 8.9  MG  --   --   --  1.8   Liver Function Tests: Recent Labs  Lab 08/20/22 1732 08/22/22 0405  AST 23 16  ALT 10 9  ALKPHOS 62 54  BILITOT 0.5 0.4  PROT 7.4 6.2*  ALBUMIN 3.5 2.9*   CBC: Recent Labs  Lab 08/20/22 1732 08/20/22 1755 08/21/22 0459 08/22/22 0405  WBC 5.5  --  3.0* 2.5*  NEUTROABS 4.0  --   --  1.2*  HGB 11.1* 10.2* 8.9* 9.0*  HCT 33.1* 30.0* 26.1* 26.1*  MCV 93.0  --  91.6 90.0  PLT 94*  --  65* 61*   CBG: No results for input(s): "GLUCAP" in the last 168 hours. Hgb A1c No results for input(s): "HGBA1C" in the last 72 hours. Lipid Profile No results for input(s): "CHOL", "HDL", "LDLCALC", "TRIG", "CHOLHDL", "LDLDIRECT" in the last 72 hours. Thyroid function studies Recent Labs    08/20/22 1732  TSH 2.046   Urinalysis    Component Value Date/Time   COLORURINE YELLOW 08/20/2022 1846   APPEARANCEUR HAZY (A) 08/20/2022 1846   LABSPEC 1.018 08/20/2022 1846   PHURINE 5.0 08/20/2022 1846   GLUCOSEU NEGATIVE 08/20/2022 1846   HGBUR NEGATIVE 08/20/2022 1846   BILIRUBINUR NEGATIVE 08/20/2022 1846   KETONESUR NEGATIVE 08/20/2022 1846   PROTEINUR NEGATIVE 08/20/2022 1846   UROBILINOGEN 0.2 09/10/2009 1329   NITRITE NEGATIVE 08/20/2022 1846   LEUKOCYTESUR LARGE (A) 08/20/2022 1846    FURTHER DISCHARGE INSTRUCTIONS:   Get Medicines reviewed and adjusted: Please take all your medications with you for your next visit with your  Primary MD   Laboratory/radiological data: Please request your Primary MD to go over all hospital tests and procedure/radiological results at the follow up, please ask your Primary MD to get all Hospital records sent to his/her office.   In some cases, they will be blood work, cultures and biopsy results pending at the time of  your discharge. Please request that your primary care M.D. goes through all the records of your hospital data and follows up on these results.   Also Note the following: If you experience worsening of your admission symptoms, develop shortness of breath, life threatening emergency, suicidal or homicidal thoughts you must seek medical attention immediately by calling 911 or calling your MD immediately  if symptoms less severe.   You must read complete instructions/literature along with all the possible adverse reactions/side effects for all the Medicines you take and that have been prescribed to you. Take any new Medicines after you have completely understood and accpet all the possible adverse reactions/side effects.    Do not drive when taking Pain medications or sleeping medications (Benzodaizepines)   Do not take more than prescribed Pain, Sleep and Anxiety Medications. It is not advisable to combine anxiety,sleep and pain medications without talking with your primary care practitioner   Special Instructions: If you have smoked or chewed Tobacco  in the last 2 yrs please stop smoking, stop any regular Alcohol  and or any Recreational drug use.   Wear Seat belts while driving.   Please note: You were cared for by a hospitalist during your hospital stay. Once you are discharged, your primary care physician will handle any further medical issues. Please note that NO REFILLS for any discharge medications will be authorized once you are discharged, as it is imperative that you return to your primary care physician (or establish a relationship with a primary care physician if  you do not have one) for your post hospital discharge needs so that they can reassess your need for medications and monitor your lab values.  Time coordinating discharge: 40 minutes  SIGNED:  Marzetta Board, MD, PhD 08/22/2022, 10:31 AM

## 2022-08-22 NOTE — TOC Transition Note (Signed)
Transition of Care [facility]) - CM/SW Discharge Note   Patient Details  Name: Fernando Ryan MRN: [id number] Date of Birth: 06/20/1951  Transition of Care [facility]) CM/SW Contact:  Ross Ludwig, LCSW Phone Number: 08/22/2022, 11:44 AM   Clinical Narrative:     Patient will be discharging back home, rolling walker was ordered from [facility], it will be delivered to the room prior to discharge.  Final next level of care: Home/Self Care Barriers to Discharge: Barriers Resolved   Patient Goals and CMS Choice Patient states their goals for this hospitalization and ongoing recovery are:: To return back home CMS Medicare.gov Compare Post Acute Care list provided to:: Patient Choice offered to / list presented to : Patient  Discharge Placement                       Discharge Plan and Services                DME Arranged: Walker rolling DME Agency: [employer] Date DME Agency Contacted: 08/22/22 Time DME Agency Contacted: [phone] Representative spoke with at DME Agency: Andee Poles            Social Determinants of Health ([place]) Interventions Food Insecurity Interventions: (P) Other (Comment) Housing Interventions: Other (Comment) Transportation Interventions: Intervention Not Indicated Utilities Interventions: Other (Comment)   Readmission Risk Interventions     No data to display

## 2022-08-22 NOTE — Discharge Instructions (Signed)
Follow with Antionette Fairy, PA-C (Inactive) in 5-7 days  Please get a complete blood count and chemistry panel checked by your Primary MD at your next visit, and again as instructed by your Primary MD. Please get your medications reviewed and adjusted by your Primary MD.  Please request your Primary MD to go over all Hospital Tests and Procedure/Radiological results at the follow up, please get all Hospital records sent to your Prim MD by signing hospital release before you go home.  In some cases, there will be blood work, cultures and biopsy results pending at the time of your discharge. Please request that your primary care M.D. goes through all the records of your hospital data and follows up on these results.  If you had Pneumonia of Lung problems at the Hospital: Please get a 2 view Chest X ray done in 6-8 weeks after hospital discharge or sooner if instructed by your Primary MD.  If you have Congestive Heart Failure: Please call your Cardiologist or Primary MD anytime you have any of the following symptoms:  1) 3 pound weight gain in 24 hours or 5 pounds in 1 week  2) shortness of breath, with or without a dry hacking cough  3) swelling in the hands, feet or stomach  4) if you have to sleep on extra pillows at night in order to breathe  Follow cardiac low salt diet and 1.5 lit/day fluid restriction.  If you have diabetes Accuchecks 4 times/day, Once in AM empty stomach and then before each meal. Log in all results and show them to your primary doctor at your next visit. If any glucose reading is under 80 or above 300 call your primary MD immediately.  If you have Seizure/Convulsions/Epilepsy: Please do not drive, operate heavy machinery, participate in activities at heights or participate in high speed sports until you have seen by Primary MD or a Neurologist and advised to do so again. Per [facility] statutes, patients with seizures are not allowed to drive until they have  been seizure-free for six months.  Use caution when using heavy equipment or power tools. Avoid working on ladders or at heights. Take showers instead of baths. Ensure the water temperature is not too high on the home water heater. Do not go swimming alone. Do not lock yourself in a room alone (i.e. bathroom). When caring for infants or small children, sit down when holding, feeding, or changing them to minimize risk of injury to the child in the event you have a seizure. Maintain good sleep hygiene. Avoid alcohol.   If you had Gastrointestinal Bleeding: Please ask your Primary MD to check a complete blood count within one week of discharge or at your next visit. Your endoscopic/colonoscopic biopsies that are pending at the time of discharge, will also need to followed by your Primary MD.  Get Medicines reviewed and adjusted. Please take all your medications with you for your next visit with your Primary MD  Please request your Primary MD to go over all hospital tests and procedure/radiological results at the follow up, please ask your Primary MD to get all Hospital records sent to his/her office.  If you experience worsening of your admission symptoms, develop shortness of breath, life threatening emergency, suicidal or homicidal thoughts you must seek medical attention immediately by calling 911 or calling your MD immediately  if symptoms less severe.  You must read complete instructions/literature along with all the possible adverse reactions/side effects for all the Medicines  you take and that have been prescribed to you. Take any new Medicines after you have completely understood and accpet all the possible adverse reactions/side effects.   Do not drive or operate heavy machinery when taking Pain medications.   Do not take more than prescribed Pain, Sleep and Anxiety Medications  Special Instructions: If you have smoked or chewed Tobacco  in the last 2 yrs please stop smoking, stop any  regular Alcohol  and or any Recreational drug use.  Wear Seat belts while driving.  Please note You were cared for by a hospitalist during your hospital stay. If you have any questions about your discharge medications or the care you received while you were in the hospital after you are discharged, you can call the unit and asked to speak with the hospitalist on call if the hospitalist that took care of you is not available. Once you are discharged, your primary care physician will handle any further medical issues. Please note that NO REFILLS for any discharge medications will be authorized once you are discharged, as it is imperative that you return to your primary care physician (or establish a relationship with a primary care physician if you do not have one) for your aftercare needs so that they can reassess your need for medications and monitor your lab values.  You can reach the hospitalist office at phone [phone] or fax [phone]   If you do not have a primary care physician, you can call [phone] for a physician referral.  Activity: As tolerated with Full fall precautions use walker/cane & assistance as needed    Diet: regular  Disposition Home

## 2022-08-22 NOTE — Progress Notes (Signed)
Patient and patient's daughter was given discharge instructions, and all questions were answered. Patient was stable for discharge and was taken to the main exit by wheelchair. 

## 2022-08-22 NOTE — Progress Notes (Signed)
Mobility Specialist - Progress Note   08/22/22 1016  Mobility  Activity Ambulated independently in hallway  Activity Response Tolerated well  Distance Ambulated (ft) 1200 ft  $Mobility charge 1 Mobility  Level of Assistance Independent  Assistive Device None  HOB Elevated/Bed Position Self regulated  Range of Motion/Exercises Active  Transport method Ambulatory  Mobility Referral Yes   Pt received in bed and agreeable to mobility. No complaints during mobility. Pt to bed after session with all needs met.      [facility]

## 2022-08-23 DIAGNOSIS — M6281 Muscle weakness (generalized): Secondary | ICD-10-CM | POA: Diagnosis not present

## 2022-08-23 LAB — URINE CULTURE: Culture: >=100000 — AB

## 2022-08-25 LAB — CULTURE, BLOOD (ROUTINE X 2)
Culture: NO GROWTH
Culture: NO GROWTH

## 2022-09-03 DIAGNOSIS — M069 Rheumatoid arthritis, unspecified: Secondary | ICD-10-CM | POA: Diagnosis not present

## 2022-09-03 DIAGNOSIS — F1120 Opioid dependence, uncomplicated: Secondary | ICD-10-CM | POA: Diagnosis not present

## 2022-09-03 DIAGNOSIS — Z79899 Other long term (current) drug therapy: Secondary | ICD-10-CM | POA: Diagnosis not present

## 2022-09-03 DIAGNOSIS — Z6821 Body mass index (BMI) 21.0-21.9, adult: Secondary | ICD-10-CM | POA: Diagnosis not present

## 2022-09-03 DIAGNOSIS — Z79891 Long term (current) use of opiate analgesic: Secondary | ICD-10-CM | POA: Diagnosis not present

## 2022-09-03 DIAGNOSIS — M5030 Other cervical disc degeneration, unspecified cervical region: Secondary | ICD-10-CM | POA: Diagnosis not present

## 2022-09-03 DIAGNOSIS — M25511 Pain in right shoulder: Secondary | ICD-10-CM | POA: Diagnosis not present

## 2022-09-06 DIAGNOSIS — M7581 Other shoulder lesions, right shoulder: Secondary | ICD-10-CM | POA: Diagnosis not present

## 2022-09-06 DIAGNOSIS — R600 Localized edema: Secondary | ICD-10-CM | POA: Diagnosis not present

## 2022-09-06 DIAGNOSIS — M47812 Spondylosis without myelopathy or radiculopathy, cervical region: Secondary | ICD-10-CM | POA: Diagnosis not present

## 2022-09-06 DIAGNOSIS — Z79899 Other long term (current) drug therapy: Secondary | ICD-10-CM | POA: Diagnosis not present

## 2022-09-06 DIAGNOSIS — M4802 Spinal stenosis, cervical region: Secondary | ICD-10-CM | POA: Diagnosis not present

## 2022-09-06 DIAGNOSIS — M50323 Other cervical disc degeneration at C6-C7 level: Secondary | ICD-10-CM | POA: Diagnosis not present

## 2022-09-06 DIAGNOSIS — M79601 Pain in right arm: Secondary | ICD-10-CM | POA: Diagnosis not present

## 2022-09-06 DIAGNOSIS — M542 Cervicalgia: Secondary | ICD-10-CM | POA: Diagnosis not present

## 2022-09-06 DIAGNOSIS — M19011 Primary osteoarthritis, right shoulder: Secondary | ICD-10-CM | POA: Diagnosis not present

## 2022-09-23 DIAGNOSIS — R634 Abnormal weight loss: Secondary | ICD-10-CM | POA: Diagnosis not present

## 2022-09-23 DIAGNOSIS — I10 Essential (primary) hypertension: Secondary | ICD-10-CM | POA: Diagnosis not present

## 2022-09-23 DIAGNOSIS — K7460 Unspecified cirrhosis of liver: Secondary | ICD-10-CM | POA: Diagnosis not present

## 2022-09-23 DIAGNOSIS — E039 Hypothyroidism, unspecified: Secondary | ICD-10-CM | POA: Diagnosis not present

## 2022-09-23 DIAGNOSIS — Z6821 Body mass index (BMI) 21.0-21.9, adult: Secondary | ICD-10-CM | POA: Diagnosis not present

## 2022-09-26 DIAGNOSIS — M47812 Spondylosis without myelopathy or radiculopathy, cervical region: Secondary | ICD-10-CM | POA: Diagnosis not present

## 2022-09-26 DIAGNOSIS — M7581 Other shoulder lesions, right shoulder: Secondary | ICD-10-CM | POA: Diagnosis not present

## 2022-10-04 DIAGNOSIS — Z6820 Body mass index (BMI) 20.0-20.9, adult: Secondary | ICD-10-CM | POA: Diagnosis not present

## 2022-10-04 DIAGNOSIS — M25511 Pain in right shoulder: Secondary | ICD-10-CM | POA: Diagnosis not present

## 2022-10-04 DIAGNOSIS — F1120 Opioid dependence, uncomplicated: Secondary | ICD-10-CM | POA: Diagnosis not present

## 2022-10-04 DIAGNOSIS — Z79891 Long term (current) use of opiate analgesic: Secondary | ICD-10-CM | POA: Diagnosis not present

## 2022-10-04 DIAGNOSIS — M069 Rheumatoid arthritis, unspecified: Secondary | ICD-10-CM | POA: Diagnosis not present

## 2022-10-04 DIAGNOSIS — M5030 Other cervical disc degeneration, unspecified cervical region: Secondary | ICD-10-CM | POA: Diagnosis not present

## 2022-10-04 DIAGNOSIS — Z79899 Other long term (current) drug therapy: Secondary | ICD-10-CM | POA: Diagnosis not present

## 2022-10-05 ENCOUNTER — Telehealth: Payer: Self-pay | Admitting: Hematology

## 2022-10-05 NOTE — Telephone Encounter (Signed)
Contacted patient to scheduled appointments. Left message with appointment details and a call back number if patient had any questions or could not accommodate the time we provided.   

## 2022-10-07 DIAGNOSIS — Z79899 Other long term (current) drug therapy: Secondary | ICD-10-CM | POA: Diagnosis not present

## 2022-10-12 DIAGNOSIS — M5412 Radiculopathy, cervical region: Secondary | ICD-10-CM | POA: Diagnosis not present

## 2022-10-20 NOTE — Assessment & Plan Note (Deleted)
--  He was diagnosed in 12/2019. His 12/27/20 MRI abdomen showed a 2.1 x1.7 cm LR-5 liver lesion with typical image findings consistent with HCC, although his AFP is normal, the diagnosis of HCC is quite certain. A biopsy has not been recommended. There is an indeterminate 1.4 cm peripancreatic LN that warrants surveillance.  --He proceeded with treatment by CT microwave ablation with Dr Laurence Ferrari on 03/04/20. He has been on observation since.

## 2022-10-20 NOTE — Progress Notes (Deleted)
[place]   [phone] [phone]   Clinic Follow up Note   Patient Care Team: Vesta Mixer (Inactive) as PCP - General (Physician Assistant)  Date of Service:  10/20/2022  CHIEF COMPLAINT: f/u of Hepatocellular carcinoma   CURRENT THERAPY:  Observation    ASSESSMENT: *** Fernando Ryan is a 71 y.o. male with   No problem-specific Assessment & Plan notes found for this encounter.  ***   PLAN:  SUMMARY OF ONCOLOGIC HISTORY: Oncology History Overview Note  Cancer Staging hepatocellular carcinoma Staging form: Liver, AJCC 8th Edition - Clinical stage from 12/28/2019: Stage IB (cT1b, cN0, cM0) - Signed by Truitt Merle, MD on 08/07/2020    hepatocellular carcinoma  12/28/2019 Imaging   MRI abdomen 12/28/19  IMPRESSION: 1. LI-RADS category LR-5 hepatocellular carcinoma measuring 2.1 by 1.7 cm lesion posteriorly in the right hepatic lobe with arterial phase enhancement, washout, and capsule appearance. Background cirrhosis noted. 2. No pancreatic mass is evident. There is a 1.4 cm peripancreatic lymph node which is likely reactive. Based on corroboration with the prior ultrasound, I suspect that the masslike appearance adjacent to the pancreatic head was due to duodenum. 3. Several small cysts or biliary hamartomas in the liver. 4. Clustered cystic lesions along the left hepatic lobe hilum are difficult to confidently separate from the biliary tree, and could represent type V clustered choledochal cysts. 5. Mild splenomegaly. 6. Paraesophageal varices, gastric varices, and splenorenal shunting compatible with portal venous hypertension.   12/28/2019 Cancer Staging   Staging form: Liver, AJCC 8th Edition - Clinical stage from 12/28/2019: Stage IB (cT1b, cN0, cM0) - Signed by Truitt Merle, MD on 08/07/2020   01/07/2020 Procedure   Upper endoscopy by Dr Silverio Decamp 01/07/20  IMPRESSION - Z-line regular, 36 cm from the incisors. - Grade  II small esophageal varices. - Small Type 1 isolated gastric varices (IGV1, varices located in the fundus), without bleeding. 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 - Normal examined duodenum. - No specimens collected.   01/07/2020 Procedure   Colonoscopy by Dr Silverio Decamp 01/07/20 IMPRESSION - One 7 mm polyp in the cecum, removed with a cold snare. Resected and retrieved. - One 16 mm polyp in the descending colon, removed with a hot snare. Resected and retrieved. - Moderate diverticulosis in the sigmoid colon, in the descending colon, in the transverse colon and in the ascending colon. - Non-bleeding internal hemorrhoids.    01/07/2020 Pathology Results   FINAL MICROSCOPIC DIAGNOSIS: 01/07/20 A. COLON, CECUM, POLYPECTOMY:  - Tubular adenoma.  - No high-grade dysplasia or carcinoma.   B. COLON, DESCENDING, POLYPECTOMY:  - Tubular adenoma.  - No high-grade dysplasia or carcinoma.   GROSS DESCRIPTION:  A: Received in formalin is a tan, soft tissue fragment that is submitted  in toto.  Size: 0.6 cm, 1 block submitted.  B: Received in formalin is a 1.0 x 0.9 x 0.8 cm rubbery tan-red mucosal  polyp.  The specimen is inked, sectioned and entirely submitted in 1  cassette.  (GRP 01/07/2020)    01/30/2020 Initial Diagnosis   Malignant neoplasm of liver, primary ([place])   02/10/2020 Imaging   CT Chest  IMPRESSION: No evidence of intrathoracic metastatic disease.   Aortic atherosclerosis.  Coronary artery calcification.   Calcified granulomas in the right lower lobe with small right-sided calcified nodes.   Right lobe liver lesion as shown by previous MRI. Gallstones. Upper abdominal varices.   03/04/2020 Procedure   CT Microwave liver ablation  by Dr Laurence Ferrari   07/25/2020 Imaging   MRI Abdomen  IMPRESSION: 1. No viable tissue identified at the thermal ablation site in the medial RIGHT hepatic lobe. 2. No evidence hepatoma elsewhere in the liver. 3.  Cholelithiasis without evidence cholecystitis.     02/02/2021 Imaging   US Abdomen  IMPRESSION: Cholelithiasis with a small amount of pericholecystic fluid and mildly prominent gallbladder wall. This is non-specific however could be due to physiologic under distension versus mild acute cholecystitis.   Findings suggestive of cirrhosis   02/02/2021 Imaging   CT AP    IMPRESSION: 1. No acute intra-abdominal or pelvic pathology. 2. Cirrhosis with evidence of portal hypertension and upper abdominal varices. Prior ablation defect in the right lobe of the liver. 3. Cholelithiasis. 4. Colonic diverticulosis. No bowel obstruction. Normal appendix. 5. Aortic Atherosclerosis (ICD10-I70.0).     04/05/2021 Imaging   MRI Abdomen  IMPRESSION: 1. Status post ablation in the right lobe of the liver with no findings to suggest local recurrence of disease or new aggressive lesions in the liver on today's study. 2. Small cavernous hemangioma in segment 4A of the liver, stable compared to prior studies. 3. Cirrhosis with evidence of portal venous hypertension, including splenomegaly and portosystemic collateral pathways, as detailed above.      INTERVAL HISTORY: *** Chais Fehringer is here for a follow up of  He was last seen by me on 04/14/2022 He presents to the clinic      All other systems were reviewed with the patient and are negative.     MEDICAL HISTORY:  Past Medical History:  Diagnosis Date   Cirrhosis ([place])    Hepatitis    Hep C   Hypertension     SURGICAL HISTORY: Past Surgical History:  Procedure Laterality Date   COLONOSCOPY WITH PROPOFOL N/A 01/07/2020   Procedure: COLONOSCOPY WITH PROPOFOL;  Surgeon: Mauri Pole, MD;  Location: [facility];  Service: Endoscopy;  Laterality: N/A;   ESOPHAGOGASTRODUODENOSCOPY (EGD) WITH PROPOFOL N/A 01/07/2020   Procedure: ESOPHAGOGASTRODUODENOSCOPY (EGD) WITH PROPOFOL;  Surgeon: Mauri Pole, MD;  Location: [facility];  Service: Endoscopy;  Laterality: N/A;   FEMUR FRACTURE SURGERY     HEMOSTASIS CLIP PLACEMENT  01/07/2020   Procedure: HEMOSTASIS CLIP PLACEMENT;  Surgeon: Mauri Pole, MD;  Location: [facility];  Service: Endoscopy;;   IR RADIOLOGIST EVAL & MGMT  01/28/2020   IR RADIOLOGIST EVAL & MGMT  04/09/2020   IR RADIOLOGIST EVAL & MGMT  08/26/2020   IR RADIOLOGIST EVAL & MGMT  06/08/2021   IR RADIOLOGIST EVAL & MGMT  12/03/2021   POLYPECTOMY  01/07/2020   Procedure: POLYPECTOMY;  Surgeon: Mauri Pole, MD;  Location: [facility];  Service: Endoscopy;;   RADIOLOGY WITH ANESTHESIA N/A 03/04/2020   Procedure: CT WITH ANESTHESIA  MICROWAVE ABLATION;  Surgeon: Jacqulynn Cadet, MD;  Location: [facility];  Service: Anesthesiology;  Laterality: N/A;    I have reviewed the social history and family history with the patient and they are unchanged from previous note.  ALLERGIES:  has No Known Allergies.  MEDICATIONS:  Current Outpatient Medications  Medication Sig Dispense Refill   Calcium Carb-Cholecalciferol (CALCIUM + D3 PO) Take 1 tablet by mouth daily.     gabapentin (NEURONTIN) 300 MG capsule Take 300 mg by mouth 3 (three) times daily.     ondansetron (ZOFRAN ODT) 4 MG disintegrating tablet '4mg'$  ODT q4 hours prn nausea/vomit (Patient taking differently: Take 4 mg by mouth every 4 (four) hours as  needed for nausea or vomiting (DISSOLVE ORALLY).) 20 tablet 0   oxyCODONE (ROXICODONE) 15 MG immediate release tablet Take 15-22.5 mg by mouth every 4 (four) hours.     No current facility-administered medications for this visit.    PHYSICAL EXAMINATION: ECOG PERFORMANCE STATUS: {CHL ONC ECOG [phone]}  There were no vitals filed for this visit. Wt Readings from Last 3 Encounters:  08/20/22 126 lb (57.2 kg)  07/03/22 125 lb (56.7 kg)  04/26/21 136 lb 8 oz (61.9 kg)    {Only keep what was examined. If exam not performed, can use .CEXAM } GENERAL:alert, no distress and  comfortable SKIN: skin color, texture, turgor are normal, no rashes or significant lesions EYES: normal, Conjunctiva are pink and non-injected, sclera clear {OROPHARYNX:no exudate, no erythema and lips, buccal mucosa, and tongue normal}  NECK: supple, thyroid normal size, non-tender, without nodularity LYMPH:  no palpable lymphadenopathy in the cervical, axillary {or inguinal} LUNGS: clear to auscultation and percussion with normal breathing effort HEART: regular rate & rhythm and no murmurs and no lower extremity edema ABDOMEN:abdomen soft, non-tender and normal bowel sounds Musculoskeletal:no cyanosis of digits and no clubbing  NEURO: alert & oriented x 3 with fluent speech, no focal motor/sensory deficits  LABORATORY DATA:  I have reviewed the data as listed    Latest Ref Rng & Units 08/22/2022    4:05 AM 08/21/2022    4:59 AM 08/20/2022    5:55 PM  CBC  WBC 4.0 - 10.5 K/uL 2.5  3.0    Hemoglobin 13.0 - 17.0 g/dL 9.0  8.9  10.2   Hematocrit 39.0 - 52.0 % 26.1  26.1  30.0   Platelets 150 - 400 K/uL 61  65          Latest Ref Rng & Units 08/22/2022    4:05 AM 08/21/2022    4:59 AM 08/20/2022    5:55 PM  CMP  Glucose 70 - 99 mg/dL 92  94  104   BUN 8 - 23 mg/dL 21  31  34   Creatinine 0.61 - 1.24 mg/dL 0.69  0.91  1.30   Sodium 135 - 145 mmol/L 141  140  140   Potassium 3.5 - 5.1 mmol/L 3.7  3.5  4.3   Chloride 98 - 111 mmol/L 113  111  106   CO2 22 - 32 mmol/L 23  22    Calcium 8.9 - 10.3 mg/dL 8.9  8.9    Total Protein 6.5 - 8.1 g/dL 6.2     Total Bilirubin 0.3 - 1.2 mg/dL 0.4     Alkaline Phos 38 - 126 U/L 54     AST 15 - 41 U/L 16     ALT 0 - 44 U/L 9         RADIOGRAPHIC STUDIES: I have personally reviewed the radiological images as listed and agreed with the findings in the report. No results found.    No orders of the defined types were placed in this encounter.  All questions were answered. The patient knows to call the clinic with any problems, questions or  concerns. No barriers to learning was detected. The total time spent in the appointment was {CHL ONC TIME VISIT - NWGNF:[id number]}.     Baldemar Friday, CMA 10/20/2022   I, Audry Riles, CMA, am acting as scribe for Truitt Merle, MD.   {Add scribe attestation statement}

## 2022-10-21 ENCOUNTER — Inpatient Hospital Stay: Payer: Medicare HMO | Attending: Hematology | Admitting: Hematology

## 2022-10-21 DIAGNOSIS — C228 Malignant neoplasm of liver, primary, unspecified as to type: Secondary | ICD-10-CM

## 2022-11-02 DIAGNOSIS — M5030 Other cervical disc degeneration, unspecified cervical region: Secondary | ICD-10-CM | POA: Diagnosis not present

## 2022-11-02 DIAGNOSIS — Z79891 Long term (current) use of opiate analgesic: Secondary | ICD-10-CM | POA: Diagnosis not present

## 2022-11-02 DIAGNOSIS — M069 Rheumatoid arthritis, unspecified: Secondary | ICD-10-CM | POA: Diagnosis not present

## 2022-11-02 DIAGNOSIS — M25511 Pain in right shoulder: Secondary | ICD-10-CM | POA: Diagnosis not present

## 2022-11-02 DIAGNOSIS — Z6820 Body mass index (BMI) 20.0-20.9, adult: Secondary | ICD-10-CM | POA: Diagnosis not present

## 2022-11-02 DIAGNOSIS — F1120 Opioid dependence, uncomplicated: Secondary | ICD-10-CM | POA: Diagnosis not present

## 2022-11-02 DIAGNOSIS — Z79899 Other long term (current) drug therapy: Secondary | ICD-10-CM | POA: Diagnosis not present

## 2022-11-22 DIAGNOSIS — I8510 Secondary esophageal varices without bleeding: Secondary | ICD-10-CM | POA: Diagnosis not present

## 2022-11-22 DIAGNOSIS — K7469 Other cirrhosis of liver: Secondary | ICD-10-CM | POA: Diagnosis not present

## 2022-11-22 DIAGNOSIS — C220 Liver cell carcinoma: Secondary | ICD-10-CM | POA: Diagnosis not present

## 2022-11-22 DIAGNOSIS — R110 Nausea: Secondary | ICD-10-CM | POA: Diagnosis not present

## 2022-11-23 ENCOUNTER — Other Ambulatory Visit: Payer: Self-pay | Admitting: Nurse Practitioner

## 2022-11-23 DIAGNOSIS — C220 Liver cell carcinoma: Secondary | ICD-10-CM

## 2022-12-02 DIAGNOSIS — M069 Rheumatoid arthritis, unspecified: Secondary | ICD-10-CM | POA: Diagnosis not present

## 2022-12-02 DIAGNOSIS — F1120 Opioid dependence, uncomplicated: Secondary | ICD-10-CM | POA: Diagnosis not present

## 2022-12-02 DIAGNOSIS — Z6821 Body mass index (BMI) 21.0-21.9, adult: Secondary | ICD-10-CM | POA: Diagnosis not present

## 2022-12-02 DIAGNOSIS — Z79899 Other long term (current) drug therapy: Secondary | ICD-10-CM | POA: Diagnosis not present

## 2022-12-02 DIAGNOSIS — M25511 Pain in right shoulder: Secondary | ICD-10-CM | POA: Diagnosis not present

## 2022-12-02 DIAGNOSIS — M5030 Other cervical disc degeneration, unspecified cervical region: Secondary | ICD-10-CM | POA: Diagnosis not present

## 2022-12-02 DIAGNOSIS — Z79891 Long term (current) use of opiate analgesic: Secondary | ICD-10-CM | POA: Diagnosis not present

## 2022-12-08 DIAGNOSIS — Z79899 Other long term (current) drug therapy: Secondary | ICD-10-CM | POA: Diagnosis not present

## 2022-12-14 ENCOUNTER — Inpatient Hospital Stay: Admission: RE | Admit: 2022-12-14 | Payer: Medicare HMO | Source: Ambulatory Visit

## 2022-12-20 ENCOUNTER — Ambulatory Visit
Admission: RE | Admit: 2022-12-20 | Discharge: 2022-12-20 | Disposition: A | Discharge status: Home / Self Care | Payer: Medicare HMO | Source: Ambulatory Visit | Attending: Nurse Practitioner | Admitting: Nurse Practitioner

## 2022-12-20 DIAGNOSIS — C220 Liver cell carcinoma: Secondary | ICD-10-CM

## 2022-12-20 MED ORDER — GADOPICLENOL 0.5 MMOL/ML IV SOLN
6.0000 mL | Freq: Once | INTRAVENOUS | Status: AC | PRN
  Administered 2022-12-20: 6 mL via INTRAVENOUS

## 2022-12-22 DIAGNOSIS — I10 Essential (primary) hypertension: Secondary | ICD-10-CM | POA: Diagnosis not present

## 2022-12-22 DIAGNOSIS — N3001 Acute cystitis with hematuria: Secondary | ICD-10-CM | POA: Diagnosis not present

## 2022-12-22 DIAGNOSIS — N189 Chronic kidney disease, unspecified: Secondary | ICD-10-CM | POA: Diagnosis not present

## 2022-12-22 DIAGNOSIS — T783XXA Angioneurotic edema, initial encounter: Secondary | ICD-10-CM | POA: Diagnosis not present

## 2022-12-22 DIAGNOSIS — N289 Disorder of kidney and ureter, unspecified: Secondary | ICD-10-CM | POA: Diagnosis not present

## 2022-12-22 DIAGNOSIS — N179 Acute kidney failure, unspecified: Secondary | ICD-10-CM | POA: Diagnosis not present

## 2022-12-25 ENCOUNTER — Encounter (HOSPITAL_COMMUNITY): Payer: Self-pay

## 2022-12-25 ENCOUNTER — Emergency Department (HOSPITAL_COMMUNITY)
Admission: EM | Admit: 2022-12-25 | Discharge: 2022-12-25 | Disposition: A | Discharge status: Home / Self Care | Payer: Medicare HMO | Attending: Emergency Medicine | Admitting: Emergency Medicine

## 2022-12-25 ENCOUNTER — Other Ambulatory Visit: Payer: Self-pay

## 2022-12-25 ENCOUNTER — Emergency Department (HOSPITAL_COMMUNITY): Payer: Medicare HMO

## 2022-12-25 DIAGNOSIS — R0602 Shortness of breath: Secondary | ICD-10-CM | POA: Diagnosis not present

## 2022-12-25 DIAGNOSIS — Z1152 Encounter for screening for COVID-19: Secondary | ICD-10-CM | POA: Insufficient documentation

## 2022-12-25 DIAGNOSIS — R5383 Other fatigue: Secondary | ICD-10-CM | POA: Diagnosis not present

## 2022-12-25 DIAGNOSIS — R059 Cough, unspecified: Secondary | ICD-10-CM | POA: Diagnosis not present

## 2022-12-25 LAB — TROPONIN I (HIGH SENSITIVITY): Troponin I (High Sensitivity): 6 ng/L (ref <18)

## 2022-12-25 LAB — CBC WITH DIFFERENTIAL/PLATELET
Abs Immature Granulocytes: 0.02 10*3/uL (ref 0.00–0.07)
Basophils Absolute: 0 10*3/uL (ref 0.0–0.1)
Basophils Relative: 0 %
Eosinophils Absolute: 0.1 10*3/uL (ref 0.0–0.5)
Eosinophils Relative: 1 %
HCT: 29.9 % — ABNORMAL LOW (ref 39.0–52.0)
Hemoglobin: 10 g/dL — ABNORMAL LOW (ref 13.0–17.0)
Immature Granulocytes: 0 %
Lymphocytes Relative: 20 %
Lymphs Abs: 1 10*3/uL (ref 0.7–4.0)
MCH: 31.4 pg (ref 26.0–34.0)
MCHC: 33.4 g/dL (ref 30.0–36.0)
MCV: 94 fL (ref 80.0–100.0)
Monocytes Absolute: 0.3 10*3/uL (ref 0.1–1.0)
Monocytes Relative: 6 %
Neutro Abs: 3.5 10*3/uL (ref 1.7–7.7)
Neutrophils Relative %: 73 %
Platelets: 88 10*3/uL — ABNORMAL LOW (ref 150–400)
RBC: 3.18 MIL/uL — ABNORMAL LOW (ref 4.22–5.81)
RDW: 13.2 % (ref 11.5–15.5)
WBC: 4.9 10*3/uL (ref 4.0–10.5)
nRBC: 0 % (ref 0.0–0.2)

## 2022-12-25 LAB — RESP PANEL BY RT-PCR (RSV, FLU A&B, COVID)  RVPGX2
Influenza A by PCR: NEGATIVE
Influenza B by PCR: NEGATIVE
Resp Syncytial Virus by PCR: NEGATIVE
SARS Coronavirus 2 by RT PCR: NEGATIVE

## 2022-12-25 LAB — COMPREHENSIVE METABOLIC PANEL
ALT: 12 U/L (ref 0–44)
AST: 23 U/L (ref 15–41)
Albumin: 3.5 g/dL (ref 3.5–5.0)
Alkaline Phosphatase: 69 U/L (ref 38–126)
Anion gap: 5 (ref 5–15)
BUN: 28 mg/dL — ABNORMAL HIGH (ref 8–23)
CO2: 24 mmol/L (ref 22–32)
Calcium: 8.9 mg/dL (ref 8.9–10.3)
Chloride: 109 mmol/L (ref 98–111)
Creatinine, Ser: 0.99 mg/dL (ref 0.61–1.24)
GFR, Estimated: >60 mL/min (ref >60)
Glucose, Bld: 113 mg/dL — ABNORMAL HIGH (ref 70–99)
Potassium: 4.9 mmol/L (ref 3.5–5.1)
Sodium: 138 mmol/L (ref 135–145)
Total Bilirubin: 0.3 mg/dL (ref 0.3–1.2)
Total Protein: 7.6 g/dL (ref 6.5–8.1)

## 2022-12-25 LAB — CBG MONITORING, ED: Glucose-Capillary: 108 mg/dL — ABNORMAL HIGH (ref 70–99)

## 2022-12-25 MED ORDER — LACTATED RINGERS IV BOLUS
500.0000 mL | Freq: Once | INTRAVENOUS | Status: AC
  Administered 2022-12-25: 500 mL via INTRAVENOUS

## 2022-12-25 NOTE — Discharge Instructions (Signed)
It is unclear why you don't feel like you have any energy today. You should call your doctor tomorrow to set up close outpatient follow up for further evaluation.  If you develop fever, headache, chest pain, trouble breathing, weakness on one side or worsening weakness, or any other new/concerning symptoms then return to the ER or call 911.

## 2022-12-25 NOTE — ED Triage Notes (Signed)
Pt reports facial swelling, fatigue, nausea, and headache x3 days and went to [place] [facility] ER and was started on keflex and prednisone.  Pt reports no improvement.  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  Denies vomiting/diarrhea.

## 2022-12-25 NOTE — ED Provider Notes (Signed)
[facility] Provider Note   CSN: [id number] Arrival date & time: 12/25/22  1430     History  Chief Complaint  Patient presents with   Fatigue    Fernando Ryan is a 72 y.o. male.  HPI 72 year old male with a history of chronic back pain and cirrhosis presents with generalized fatigue.  He states he read simply woke up with facial swelling, went to a hospital in [facility], was placed on amlodipine 2.5 mg, Keflex for 1 week, and cetirizine.  He states that his blood pressure seems to be better today but ever since waking up he has had progressive fatigue.  He has no appetite or energy.  He denies any unilateral weakness.  He had a cough since yesterday and had some transient dyspnea this morning, about 5 hours or more before I saw him.  No chest pain, headache, vomiting, diarrhea, swelling.  The facial swelling seems a lot better.  Home Medications Prior to Admission medications   Medication Sig Start Date End Date Taking? Authorizing Provider  Calcium Carb-Cholecalciferol (CALCIUM + D3 PO) Take 1 tablet by mouth daily.    [provider]  gabapentin (NEURONTIN) 300 MG capsule Take 300 mg by mouth 3 (three) times daily. 10/18/21   [provider]  ondansetron (ZOFRAN ODT) 4 MG disintegrating tablet 36m ODT q4 hours prn nausea/vomit Patient taking differently: Take 4 mg by mouth every 4 (four) hours as needed for nausea or vomiting (DISSOLVE ORALLY). 02/03/21   FDeno Etienne DO  oxyCODONE (ROXICODONE) 15 MG immediate release tablet Take 15-22.5 mg by mouth every 4 (four) hours.    [provider]      Allergies    Patient has no known allergies.    Review of Systems   Review of Systems  Constitutional:  Positive for fatigue. Negative for fever.  Respiratory:  Positive for cough and shortness of breath.   Cardiovascular:  Negative for chest pain.  Gastrointestinal:  Negative for abdominal pain, diarrhea and  vomiting.  Neurological:  Positive for weakness. Negative for syncope, light-headedness and headaches.    Physical Exam Updated Vital Signs BP (!) 149/87   Pulse 71   Temp 98.2 F (36.8 C)   Resp 15   Wt 57 kg   SpO2 100%   BMI 21.57 kg/m  Physical Exam Vitals and nursing note reviewed.  Constitutional:      General: He is not in acute distress.    Appearance: He is well-developed. He is not ill-appearing or diaphoretic.  HENT:     Head: Normocephalic and atraumatic.     Comments: No appreciable facial swelling on exam Eyes:     Extraocular Movements: Extraocular movements intact.  Cardiovascular:     Rate and Rhythm: Normal rate and regular rhythm.     Heart sounds: Normal heart sounds.  Pulmonary:     Effort: Pulmonary effort is normal.     Breath sounds: Normal breath sounds. No wheezing or rales.  Abdominal:     General: There is no distension.     Palpations: Abdomen is soft.     Tenderness: There is no abdominal tenderness.  Musculoskeletal:     Right lower leg: No edema.     Left lower leg: No edema.  Skin:    General: Skin is warm and dry.  Neurological:     Mental Status: He is alert.     Comments: CN 3-12 grossly intact. 5/5 strength in all  4 extremities. Grossly normal sensation. Normal finger to nose.      ED Results / Procedures / Treatments   Labs (all labs ordered are listed, but only abnormal results are displayed) Labs Reviewed  COMPREHENSIVE METABOLIC PANEL - Abnormal; Notable for the following components:      Result Value   Glucose, Bld 113 (*)    BUN 28 (*)    All other components within normal limits  CBC WITH DIFFERENTIAL/PLATELET - Abnormal; Notable for the following components:   RBC 3.18 (*)    Hemoglobin 10.0 (*)    HCT 29.9 (*)    Platelets 88 (*)    All other components within normal limits  CBG MONITORING, ED - Abnormal; Notable for the following components:   Glucose-Capillary 108 (*)    All other components within normal  limits  RESP PANEL BY RT-PCR (RSV, FLU A&B, COVID)  [id number]  TROPONIN I (HIGH SENSITIVITY)    EKG EKG Interpretation  Date/Time:  Sunday December 25 2022 15:27:02 EST Ventricular Rate:  67 PR Interval:  171 QRS Duration: 98 QT Interval:  406 QTC Calculation: 429 R Axis:   26 Text Interpretation: Sinus rhythm no acute ST/T changes or significant change since Oct 2023 Confirmed by Sherwood Gambler [id number]) on 12/25/2022 3:28:55 PM  Radiology DG Chest 2 View  Result Date: 12/25/2022 CLINICAL DATA:  Cough EXAM: CHEST - 2 VIEW COMPARISON:  08/20/2022 FINDINGS: The heart size and mediastinal contours are within normal limits. Both lungs are clear. The visualized skeletal structures are unremarkable. IMPRESSION: No active cardiopulmonary disease. Electronically Signed   By: Kathreen Devoid M.D.   On: 12/25/2022 16:04    Procedures Procedures    Medications Ordered in ED Medications  lactated ringers bolus 500 mL (0 mLs Intravenous Stopped 12/25/22 1626)    ED Course/ Medical Decision Making/ A&P                             Medical Decision Making Amount and/or Complexity of Data Reviewed External Data Reviewed: notes. Labs: ordered.    Details: Chronic anemia. No AKI or LFT dysfunction. Normal troponin. Chronic thrombocytopenia. Radiology: ordered and independent interpretation performed.    Details: No CHF/pneumonia ECG/medicine tests: ordered and independent interpretation performed.    Details: No acute ST/T changes   Patient presents with generalized weakness.  No focal findings.  He reports lack of energy.  Could be due to his recent meds such as Keflex but overall his labs are unremarkable compared to baseline, he has stable vitals besides some mild hypertension, and is otherwise well-appearing.  I highly doubt CNS emergency such as stroke.  I do not think CT head is warranted.  Labs are otherwise unremarkable and since he had symptoms all day I do not think second troponin is  needed for what is otherwise unlikely to be ACS/MI.  Will discharge home with return precautions.        Final Clinical Impression(s) / ED Diagnoses Final diagnoses:  Fatigue, unspecified type    Rx / DC Orders ED Discharge Orders     None         Sherwood Gambler, MD 12/25/22 1712

## 2022-12-28 ENCOUNTER — Other Ambulatory Visit: Payer: Medicare HMO

## 2022-12-31 DIAGNOSIS — M25511 Pain in right shoulder: Secondary | ICD-10-CM | POA: Diagnosis not present

## 2022-12-31 DIAGNOSIS — Z79891 Long term (current) use of opiate analgesic: Secondary | ICD-10-CM | POA: Diagnosis not present

## 2022-12-31 DIAGNOSIS — M5030 Other cervical disc degeneration, unspecified cervical region: Secondary | ICD-10-CM | POA: Diagnosis not present

## 2022-12-31 DIAGNOSIS — Z79899 Other long term (current) drug therapy: Secondary | ICD-10-CM | POA: Diagnosis not present

## 2022-12-31 DIAGNOSIS — F1120 Opioid dependence, uncomplicated: Secondary | ICD-10-CM | POA: Diagnosis not present

## 2022-12-31 DIAGNOSIS — Z6820 Body mass index (BMI) 20.0-20.9, adult: Secondary | ICD-10-CM | POA: Diagnosis not present

## 2022-12-31 DIAGNOSIS — M069 Rheumatoid arthritis, unspecified: Secondary | ICD-10-CM | POA: Diagnosis not present

## 2023-01-04 DIAGNOSIS — Z79899 Other long term (current) drug therapy: Secondary | ICD-10-CM | POA: Diagnosis not present

## 2023-01-25 ENCOUNTER — Ambulatory Visit: Payer: Medicare HMO | Admitting: Physician Assistant

## 2023-01-27 DIAGNOSIS — M5412 Radiculopathy, cervical region: Secondary | ICD-10-CM | POA: Diagnosis not present

## 2023-01-28 DIAGNOSIS — F1120 Opioid dependence, uncomplicated: Secondary | ICD-10-CM | POA: Diagnosis not present

## 2023-01-28 DIAGNOSIS — M5030 Other cervical disc degeneration, unspecified cervical region: Secondary | ICD-10-CM | POA: Diagnosis not present

## 2023-01-28 DIAGNOSIS — M25511 Pain in right shoulder: Secondary | ICD-10-CM | POA: Diagnosis not present

## 2023-01-28 DIAGNOSIS — Z6821 Body mass index (BMI) 21.0-21.9, adult: Secondary | ICD-10-CM | POA: Diagnosis not present

## 2023-01-28 DIAGNOSIS — M069 Rheumatoid arthritis, unspecified: Secondary | ICD-10-CM | POA: Diagnosis not present

## 2023-01-28 DIAGNOSIS — Z79899 Other long term (current) drug therapy: Secondary | ICD-10-CM | POA: Diagnosis not present

## 2023-02-01 DIAGNOSIS — Z79899 Other long term (current) drug therapy: Secondary | ICD-10-CM | POA: Diagnosis not present

## 2023-02-08 DIAGNOSIS — M549 Dorsalgia, unspecified: Secondary | ICD-10-CM | POA: Diagnosis not present

## 2023-02-08 DIAGNOSIS — G8929 Other chronic pain: Secondary | ICD-10-CM | POA: Diagnosis not present

## 2023-02-20 DIAGNOSIS — M47812 Spondylosis without myelopathy or radiculopathy, cervical region: Secondary | ICD-10-CM | POA: Diagnosis not present

## 2023-02-20 DIAGNOSIS — M5412 Radiculopathy, cervical region: Secondary | ICD-10-CM | POA: Diagnosis not present

## 2023-02-22 ENCOUNTER — Other Ambulatory Visit: Payer: Self-pay | Admitting: Neurosurgery

## 2023-02-23 ENCOUNTER — Other Ambulatory Visit: Payer: Self-pay

## 2023-02-24 ENCOUNTER — Telehealth: Payer: Self-pay

## 2023-02-24 NOTE — Telephone Encounter (Signed)
Faxed signed Surgical consent to [facility] as per Dr. Mosetta Putt. Placed original in the to be scanned file. Received fax confirmation.

## 2023-03-06 DIAGNOSIS — K7460 Unspecified cirrhosis of liver: Secondary | ICD-10-CM | POA: Diagnosis not present

## 2023-03-06 DIAGNOSIS — G8929 Other chronic pain: Secondary | ICD-10-CM | POA: Diagnosis not present

## 2023-03-06 DIAGNOSIS — M549 Dorsalgia, unspecified: Secondary | ICD-10-CM | POA: Diagnosis not present

## 2023-03-06 DIAGNOSIS — Z6821 Body mass index (BMI) 21.0-21.9, adult: Secondary | ICD-10-CM | POA: Diagnosis not present

## 2023-03-06 DIAGNOSIS — Z01818 Encounter for other preprocedural examination: Secondary | ICD-10-CM | POA: Diagnosis not present

## 2023-03-08 ENCOUNTER — Telehealth: Payer: Self-pay | Admitting: Hematology

## 2023-03-08 NOTE — Pre-Procedure Instructions (Signed)
Surgical Instructions    Your procedure is scheduled on Thursday, May 2nd.  Report to [facility] Main Entrance "A" at 10:30 A.M., then check in with the Admitting office.  Call this number if you have problems the morning of surgery:  [phone]  If you have any questions prior to your surgery date call [phone]: Open Monday-Friday 8am-4pm If you experience any cold or flu symptoms such as cough, fever, chills, shortness of breath, etc. between now and your scheduled surgery, please notify us at the above number.     Remember:  Do not eat or drink after midnight the night before your surgery     Take these medicines the morning of surgery with A SIP OF WATER  gabapentin (NEURONTIN)   If needed: acetaminophen (TYLENOL)  ondansetron (ZOFRAN ODT)  oxyCODONE (ROXICODONE)    Follow your surgeon's instructions on when to stop Aspirin.  If no instructions were given by your surgeon then you will need to call the office to get those instructions.     As of today, STOP taking any Aleve, Naproxen, Ibuprofen, Motrin, Advil, Goody's, BC's, all herbal medications, fish oil, and all vitamins. This includes diclofenac Sodium (VOLTAREN) gel.                     Do NOT Smoke (Tobacco/Vaping) for 24 hours prior to your procedure.  If you use a CPAP at night, you may bring your mask/headgear for your overnight stay.   Contacts, glasses, piercing's, hearing aid's, dentures or partials may not be worn into surgery, please bring cases for these belongings.    For patients admitted to the hospital, discharge time will be determined by your treatment team.   Patients discharged the day of surgery will not be allowed to drive home, and someone needs to stay with them for 24 hours.  SURGICAL WAITING ROOM VISITATION Patients having surgery or a procedure may have no more than 2 support people in the waiting area - these visitors may rotate.   Children under the age of 69 must have an adult with  them who is not the patient. If the patient needs to stay at the hospital during part of their recovery, the visitor guidelines for inpatient rooms apply. Pre-op nurse will coordinate an appropriate time for 1 support person to accompany patient in pre-op.  This support person may not rotate.   Please refer to the [facility] website for the visitor guidelines for Inpatients (after your surgery is over and you are in a regular room).    Special instructions:    Day of Surgery: Take a shower with CHG soap. Do not wear jewelry  Do not wear lotions, powders, colognes, or deodorant. Men may shave face and neck. Do not bring valuables to the hospital. [facility] is not responsible for any belongings or valuables. Wear Clean/Comfortable clothing the morning of surgery Remember to brush your teeth WITH YOUR REGULAR TOOTHPASTE.   Please read over the following fact sheets that you were given.    If you received a COVID test during your pre-op visit  it is requested that you wear a mask when out in public, stay away from anyone that may not be feeling well and notify your surgeon if you develop symptoms. If you have been in contact with anyone that has tested positive in the last 10 days please notify you surgeon.

## 2023-03-08 NOTE — Telephone Encounter (Signed)
Contacted patient to scheduled appointments. Patient is aware of appointments that are scheduled.   

## 2023-03-09 ENCOUNTER — Other Ambulatory Visit: Payer: Self-pay

## 2023-03-09 ENCOUNTER — Encounter (HOSPITAL_COMMUNITY): Payer: Self-pay

## 2023-03-09 ENCOUNTER — Encounter (HOSPITAL_COMMUNITY)
Admission: RE | Admit: 2023-03-09 | Discharge: 2023-03-09 | Disposition: A | Discharge status: Home / Self Care | Payer: Medicare HMO | Source: Ambulatory Visit | Attending: Neurosurgery | Admitting: Neurosurgery

## 2023-03-09 VITALS — BP 148/70 | HR 58 | Temp 98.3°F | Resp 18 | Ht 63.0 in | Wt 130.1 lb

## 2023-03-09 DIAGNOSIS — D649 Anemia, unspecified: Secondary | ICD-10-CM | POA: Diagnosis not present

## 2023-03-09 DIAGNOSIS — Z8505 Personal history of malignant neoplasm of liver: Secondary | ICD-10-CM | POA: Insufficient documentation

## 2023-03-09 DIAGNOSIS — B1920 Unspecified viral hepatitis C without hepatic coma: Secondary | ICD-10-CM | POA: Diagnosis not present

## 2023-03-09 DIAGNOSIS — G894 Chronic pain syndrome: Secondary | ICD-10-CM | POA: Diagnosis not present

## 2023-03-09 DIAGNOSIS — K7030 Alcoholic cirrhosis of liver without ascites: Secondary | ICD-10-CM | POA: Diagnosis not present

## 2023-03-09 DIAGNOSIS — Z1389 Encounter for screening for other disorder: Secondary | ICD-10-CM | POA: Diagnosis not present

## 2023-03-09 DIAGNOSIS — Z01812 Encounter for preprocedural laboratory examination: Secondary | ICD-10-CM | POA: Diagnosis not present

## 2023-03-09 DIAGNOSIS — D696 Thrombocytopenia, unspecified: Secondary | ICD-10-CM | POA: Insufficient documentation

## 2023-03-09 DIAGNOSIS — Z01818 Encounter for other preprocedural examination: Secondary | ICD-10-CM

## 2023-03-09 DIAGNOSIS — Z79891 Long term (current) use of opiate analgesic: Secondary | ICD-10-CM | POA: Diagnosis not present

## 2023-03-09 DIAGNOSIS — K7469 Other cirrhosis of liver: Secondary | ICD-10-CM

## 2023-03-09 DIAGNOSIS — M542 Cervicalgia: Secondary | ICD-10-CM | POA: Diagnosis not present

## 2023-03-09 DIAGNOSIS — M25511 Pain in right shoulder: Secondary | ICD-10-CM | POA: Diagnosis not present

## 2023-03-09 HISTORY — DX: Spondylosis without myelopathy or radiculopathy, cervical region: M47.812

## 2023-03-09 HISTORY — DX: Unspecified osteoarthritis, unspecified site: M19.90

## 2023-03-09 LAB — PROTIME-INR
INR: 1.2 (ref 0.8–1.2)
Prothrombin Time: 14.6 seconds (ref 11.4–15.2)

## 2023-03-09 LAB — SURGICAL PCR SCREEN
MRSA, PCR: NEGATIVE
Staphylococcus aureus: NEGATIVE

## 2023-03-09 LAB — TYPE AND SCREEN: ABO/RH(D): O POS

## 2023-03-09 NOTE — Progress Notes (Signed)
PCP - Mikki Santee, DNP Cardiologist - denies  PPM/ICD - denies   Chest x-ray - 12/25/22 EKG - 12/25/22 Stress Test - denies ECHO - denies Cardiac Cath - denies  Sleep Study - denies   DM- denies  Blood Thinner Instructions: n/a Aspirin Instructions: f/u with surgeon  ERAS Protcol - no, NPO   COVID TEST- n/a   Anesthesia review: yes, pt sees a provider r/t to his cirrhosis and hepatitis C. His platelets seem to trend low. CBC and CMP just drawn on 4/23 at PCP. Records requested. Unclear whether the provider who follows his liver issues is aware of surgery. Per pt, she is aware.   Patient denies shortness of breath, fever, cough and chest pain at PAT appointment   All instructions explained to the patient, with a verbal understanding of the material. Patient agrees to go over the instructions while at home for a better understanding.  The opportunity to ask questions was provided.

## 2023-03-10 ENCOUNTER — Telehealth: Payer: Self-pay

## 2023-03-10 ENCOUNTER — Inpatient Hospital Stay: Payer: Medicare HMO | Attending: Hematology | Admitting: Hematology

## 2023-03-10 ENCOUNTER — Encounter: Payer: Self-pay | Admitting: Hematology

## 2023-03-10 VITALS — BP 163/72 | HR 59 | Temp 97.8°F | Resp 18 | Ht 63.0 in | Wt 130.5 lb

## 2023-03-10 DIAGNOSIS — Z79899 Other long term (current) drug therapy: Secondary | ICD-10-CM | POA: Insufficient documentation

## 2023-03-10 DIAGNOSIS — K7460 Unspecified cirrhosis of liver: Secondary | ICD-10-CM | POA: Insufficient documentation

## 2023-03-10 DIAGNOSIS — C220 Liver cell carcinoma: Secondary | ICD-10-CM | POA: Diagnosis not present

## 2023-03-10 DIAGNOSIS — D124 Benign neoplasm of descending colon: Secondary | ICD-10-CM | POA: Insufficient documentation

## 2023-03-10 DIAGNOSIS — C228 Malignant neoplasm of liver, primary, unspecified as to type: Secondary | ICD-10-CM | POA: Diagnosis not present

## 2023-03-10 DIAGNOSIS — I2510 Atherosclerotic heart disease of native coronary artery without angina pectoris: Secondary | ICD-10-CM | POA: Insufficient documentation

## 2023-03-10 DIAGNOSIS — D696 Thrombocytopenia, unspecified: Secondary | ICD-10-CM | POA: Diagnosis not present

## 2023-03-10 DIAGNOSIS — I8510 Secondary esophageal varices without bleeding: Secondary | ICD-10-CM | POA: Diagnosis not present

## 2023-03-10 NOTE — Assessment & Plan Note (Addendum)
-  He was diagnosed in 12/2019. His 12/27/20 MRI abdomen showed a 2.1 x1.7 cm LR-5 liver lesion with typical image findings consistent with HCC, although his AFP is normal, the diagnosis of HCC is quite certain. A biopsy has not been recommended. There is an indeterminate 1.4 cm peripancreatic LN that warrants surveillance. CT chest was negative for metastasis.  -He was previously offered liver transplant by [facility], but he did not proceed. He is not a surgical resection candidate due to his underlying liver cirrhosis and portal hypertension. -He has evidence of esophageal and gastric varices, followed by GI Dr. Lavon Paganini -He proceeded with treatment by CT microwave ablation with Dr Archer Asa on 03/04/20. He has been on observation since.  -Surveillance liver MRI on January 09, 2023 showed unchanged subcapsular ablation defect of the medial posterior right lobe of the liver, hepatic segment VI, measuring 1.6 x 1.5 cm. No associated contrast enhancement. LI-RADS TR, nonviable. Multiple additional foci of arterial phase hyperenhancement measuring 1 cm or smaller are new, LI-RADS 3, will monitor, repeat liver MRI in August 2024.

## 2023-03-10 NOTE — Anesthesia Preprocedure Evaluation (Signed)
Anesthesia Evaluation  Patient identified by MRN, date of birth, ID band Patient awake    Reviewed: Allergy & Precautions, NPO status , Patient's Chart, lab work & pertinent test results  Airway Mallampati: II  TM Distance: >3 FB Neck ROM: Full    Dental  (+) Edentulous Upper, Edentulous Lower   Pulmonary Patient did not abstain from smoking., former smoker   Pulmonary exam normal breath sounds clear to auscultation       Cardiovascular hypertension, Pt. on medications  Rhythm:Regular Rate:Normal     Neuro/Psych negative neurological ROS  negative psych ROS   GI/Hepatic negative GI ROS,,,(+) Hepatitis -, C  Endo/Other  negative endocrine ROS    Renal/GU Renal disease     Musculoskeletal  (+) Arthritis ,    Abdominal   Peds  Hematology negative hematology ROS (+)   Anesthesia Other Findings HCC  Reproductive/Obstetrics                             Anesthesia Physical Anesthesia Plan  ASA: 3  Anesthesia Plan: General   Post-op Pain Management: Gabapentin PO (pre-op)*   Induction: Intravenous  PONV Risk Score and Plan: 1 and Midazolam, Dexamethasone, Ondansetron and Treatment may vary due to age or medical condition  Airway Management Planned: Oral ETT and Video Laryngoscope Planned  Additional Equipment: Arterial line  Intra-op Plan:   Post-operative Plan: Extubation in OR  Informed Consent: I have reviewed the patients History and Physical, chart, labs and discussed the procedure including the risks, benefits and alternatives for the proposed anesthesia with the patient or authorized representative who has indicated his/her understanding and acceptance.     Dental advisory given  Plan Discussed with: CRNA  Anesthesia Plan Comments: (PAT note by Antionette Poles, PA-C: 72 year old male with pertinent history of cirrhosis secondary to EtOH abuse and hepatitis C (treated)  complicated by esophageal varices (small, grade 2 by endoscopy 12/2019) and hepatocellular carcinoma s/p radiation ablation 02/2020 and now in remission.  This is primarily followed by Annamarie Major, NP at the [facility] hepatology clinic. No history of ascites.  No history of variceal bleed.  Liver synthetic function is noted to be fairly well-preserved.  Patient was previously on nadolol but was stopped for unclear reasons, not restarted at last visit due to hypotension.  He was seen by hematologist/oncologist Dr. Mosetta Putt 03/10/2023 request of his PCP for preop evaluation.  Per note, "Surveillance liver MRI on January 09, 2023 showed unchanged subcapsular ablation defect of the medial posterior right lobe of the liver, hepatic segment VI, measuring 1.6 x 1.5 cm. No associated contrast enhancement. LI-RADS TR, nonviable. Multiple additional foci of arterial phase hyperenhancement measuring 1 cm or smaller are new, LI-RADS 3, will monitor, repeat liver MRI in August 2024.PLAN: - plan to call Neurosurgeon to give clearance - f/u as needed."  CMP and CBC 03/06/2023 from PCP office reviewed.  Notable for mild anemia with hemoglobin 11.5 and chronic thrombocytopenia with platelets 88k.  TSH also noted to be elevated at 7.030 however T4 was normal so no intervention was felt needed.  Labs otherwise unremarkable, creatinine 1.13, sodium 140, potassium 4.6, albumin 4.8, AST 23, ALT 10, T. bili <0.2, WBC 5.7 INR 1.1.  Copy of full results on patient chart.  EKG 12/25/2022: Sinus rhythm.  Rate 67.   )        Anesthesia Quick Evaluation

## 2023-03-10 NOTE — Telephone Encounter (Signed)
Spoke with Aram Beecham triage nurse for Dr. Clovia Cuff with [facility] [phone]).  Informed Aram Beecham that Dr. Mosetta Putt would like to speak with Dr. Conchita Paris today if possible regarding this mutual pt.  Aram Beecham stated that Dr. Conchita Paris is currently in surgery performing a craniology and not sure as to when he will be out of surgery.  Requested if Dr. Conchita Paris could please contact Dr. Mosetta Putt on her cellphone sometime today or this evening regarding this pt.  Provided Aram Beecham with Dr. Latanya Maudlin cellphone number.  Aram Beecham stated she will send the message to Dr. Conchita Paris.  Notified Dr. Mosetta Putt of the conversation with Dr. Val Riles office.

## 2023-03-10 NOTE — Progress Notes (Signed)
[facility]   [phone] [phone]   Clinic Follow up Note   Patient Care Team: Phyllis Ginger (Inactive) as PCP - General (Physician Assistant)  Date of Service:  03/10/2023  CHIEF COMPLAINT: f/u of  Hepatocellular carcinoma    CURRENT THERAPY:  Observation   ASSESSMENT:  Fernando Ryan is a 72 y.o. male with   hepatocellular carcinoma -He was diagnosed in 12/2019. His 12/27/20 MRI abdomen showed a 2.1 x1.7 cm LR-5 liver lesion with typical image findings consistent with HCC, although his AFP is normal, the diagnosis of HCC is quite certain. A biopsy has not been recommended. There is an indeterminate 1.4 cm peripancreatic LN that warrants surveillance. CT chest was negative for metastasis.  -He was previously offered liver transplant by [facility], but he did not proceed. He is not a surgical resection candidate due to his underlying liver cirrhosis and portal hypertension. -He has evidence of esophageal and gastric varices, followed by GI Dr. Lavon Paganini -He proceeded with treatment by CT microwave ablation with Dr Archer Asa on 03/04/20. He has been on observation since.  -Surveillance liver MRI on January 09, 2023 showed unchanged subcapsular ablation defect of the medial posterior right lobe of the liver, hepatic segment VI, measuring 1.6 x 1.5 cm. No associated contrast enhancement. LI-RADS TR, nonviable. Multiple additional foci of arterial phase hyperenhancement measuring 1 cm or smaller are new, LI-RADS 3, will monitor, repeat liver MRI in August 2024. -He will follow-up with NP Dawn Drazek  Thrombocytopenia -Secondary to his liver cirrhosis -His platelet has been around 80-100K -He is scheduled for cervical spine fusion surgery, he is cleared to have the surgery.  I recommend 1 unit of platelet transfusion before surgery. -If higher platelet count (>100K) is desired before surgery, I can start him on Doptelet (although the approved  indication is plt<50K) but it needs to be started 2 weeks before surgery, and his surgery is scheduled for next Wednesday. -I have reached out to his neurosurgeon Dr. Conchita Paris.    PLAN: - plan to call Neurosurgeon to give clearance - f/u as needed  SUMMARY OF ONCOLOGIC HISTORY: Oncology History Overview Note  Cancer Staging hepatocellular carcinoma Staging form: Liver, AJCC 8th Edition - Clinical stage from 12/28/2019: Stage IB (cT1b, cN0, cM0) - Signed by Malachy Mood, MD on 08/07/2020    hepatocellular carcinoma  12/28/2019 Imaging   MRI abdomen 12/28/19  IMPRESSION: 1. LI-RADS category LR-5 hepatocellular carcinoma measuring 2.1 by 1.7 cm lesion posteriorly in the right hepatic lobe with arterial phase enhancement, washout, and capsule appearance. Background cirrhosis noted. 2. No pancreatic mass is evident. There is a 1.4 cm peripancreatic lymph node which is likely reactive. Based on corroboration with the prior ultrasound, I suspect that the masslike appearance adjacent to the pancreatic head was due to duodenum. 3. Several small cysts or biliary hamartomas in the liver. 4. Clustered cystic lesions along the left hepatic lobe hilum are difficult to confidently separate from the biliary tree, and could represent type V clustered choledochal cysts. 5. Mild splenomegaly. 6. Paraesophageal varices, gastric varices, and splenorenal shunting compatible with portal venous hypertension.   12/28/2019 Cancer Staging   Staging form: Liver, AJCC 8th Edition - Clinical stage from 12/28/2019: Stage IB (cT1b, cN0, cM0) - Signed by Malachy Mood, MD on 08/07/2020   01/07/2020 Procedure   Upper endoscopy by Dr Lavon Paganini 01/07/20  IMPRESSION - Z-line regular, 36 cm from the incisors. - Grade II small esophageal varices. - Small Type 1  isolated gastric varices (IGV1, varices located in the fundus), without bleeding. 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 - Normal examined  duodenum. - No specimens collected.   01/07/2020 Procedure   Colonoscopy by Dr Lavon Paganini 01/07/20 IMPRESSION - One 7 mm polyp in the cecum, removed with a cold snare. Resected and retrieved. - One 16 mm polyp in the descending colon, removed with a hot snare. Resected and retrieved. - Moderate diverticulosis in the sigmoid colon, in the descending colon, in the transverse colon and in the ascending colon. - Non-bleeding internal hemorrhoids.    01/07/2020 Pathology Results   FINAL MICROSCOPIC DIAGNOSIS: 01/07/20 A. COLON, CECUM, POLYPECTOMY:  - Tubular adenoma.  - No high-grade dysplasia or carcinoma.   B. COLON, DESCENDING, POLYPECTOMY:  - Tubular adenoma.  - No high-grade dysplasia or carcinoma.   GROSS DESCRIPTION:  A: Received in formalin is a tan, soft tissue fragment that is submitted  in toto.  Size: 0.6 cm, 1 block submitted.  B: Received in formalin is a 1.0 x 0.9 x 0.8 cm rubbery tan-red mucosal  polyp.  The specimen is inked, sectioned and entirely submitted in 1  cassette.  (GRP 01/07/2020)    01/30/2020 Initial Diagnosis   Malignant neoplasm of liver, primary (HCC)   02/10/2020 Imaging   CT Chest  IMPRESSION: No evidence of intrathoracic metastatic disease.   Aortic atherosclerosis.  Coronary artery calcification.   Calcified granulomas in the right lower lobe with small right-sided calcified nodes.   Right lobe liver lesion as shown by previous MRI. Gallstones. Upper abdominal varices.   03/04/2020 Procedure   CT Microwave liver ablation by Dr Archer Asa   07/25/2020 Imaging   MRI Abdomen  IMPRESSION: 1. No viable tissue identified at the thermal ablation site in the medial RIGHT hepatic lobe. 2. No evidence hepatoma elsewhere in the liver. 3. Cholelithiasis without evidence cholecystitis.     02/02/2021 Imaging   US Abdomen  IMPRESSION: Cholelithiasis with a small amount of pericholecystic fluid and mildly prominent gallbladder wall. This is  non-specific however could be due to physiologic under distension versus mild acute cholecystitis.   Findings suggestive of cirrhosis   02/02/2021 Imaging   CT AP    IMPRESSION: 1. No acute intra-abdominal or pelvic pathology. 2. Cirrhosis with evidence of portal hypertension and upper abdominal varices. Prior ablation defect in the right lobe of the liver. 3. Cholelithiasis. 4. Colonic diverticulosis. No bowel obstruction. Normal appendix. 5. Aortic Atherosclerosis (ICD10-I70.0).     04/05/2021 Imaging   MRI Abdomen  IMPRESSION: 1. Status post ablation in the right lobe of the liver with no findings to suggest local recurrence of disease or new aggressive lesions in the liver on today's study. 2. Small cavernous hemangioma in segment 4A of the liver, stable compared to prior studies. 3. Cirrhosis with evidence of portal venous hypertension, including splenomegaly and portosystemic collateral pathways, as detailed above.      INTERVAL HISTORY:  Tavaris Eudy is here for a follow up of  Hepatocellular carcinoma. He was last seen by me on 04/26/2021. He presents to the clinic accompanied by daughter. Pt schedule for Neuro surgery next week. Pt denied having any problems or issues.Pt overall is doing clinically well.  All other systems were reviewed with the patient and are negative.  MEDICAL HISTORY:  Past Medical History:  Diagnosis Date   Arthritis    Cervical spondylosis    Cirrhosis (HCC)    Hepatitis    Hep C   Hypertension  SURGICAL HISTORY: Past Surgical History:  Procedure Laterality Date   COLONOSCOPY WITH PROPOFOL N/A 01/07/2020   Procedure: COLONOSCOPY WITH PROPOFOL;  Surgeon: Napoleon Form, MD;  Location: [facility];  Service: Endoscopy;  Laterality: N/A;   ESOPHAGOGASTRODUODENOSCOPY (EGD) WITH PROPOFOL N/A 01/07/2020   Procedure: ESOPHAGOGASTRODUODENOSCOPY (EGD) WITH PROPOFOL;  Surgeon: Napoleon Form, MD;  Location: [facility];   Service: Endoscopy;  Laterality: N/A;   FEMUR FRACTURE SURGERY Right    HEMOSTASIS CLIP PLACEMENT  01/07/2020   Procedure: HEMOSTASIS CLIP PLACEMENT;  Surgeon: Napoleon Form, MD;  Location: [facility];  Service: Endoscopy;;   IR RADIOLOGIST EVAL & MGMT  01/28/2020   IR RADIOLOGIST EVAL & MGMT  04/09/2020   IR RADIOLOGIST EVAL & MGMT  08/26/2020   IR RADIOLOGIST EVAL & MGMT  06/08/2021   IR RADIOLOGIST EVAL & MGMT  12/03/2021   POLYPECTOMY  01/07/2020   Procedure: POLYPECTOMY;  Surgeon: Napoleon Form, MD;  Location: [facility];  Service: Endoscopy;;   RADIOLOGY WITH ANESTHESIA N/A 03/04/2020   Procedure: CT WITH ANESTHESIA  MICROWAVE ABLATION;  Surgeon: Malachy Moan, MD;  Location: [facility];  Service: Anesthesiology;  Laterality: N/A;    I have reviewed the social history and family history with the patient and they are unchanged from previous note.  ALLERGIES:  has No Known Allergies.  MEDICATIONS:  Current Outpatient Medications  Medication Sig Dispense Refill   acetaminophen (TYLENOL) 500 MG tablet Take 1,000 mg by mouth every 6 (six) hours as needed for moderate pain.     aspirin EC 81 MG tablet Take 81 mg by mouth daily. Swallow whole.     diclofenac Sodium (VOLTAREN) 1 % GEL Apply 1 Application topically 4 (four) times daily as needed (pain).     gabapentin (NEURONTIN) 300 MG capsule Take 300 mg by mouth 3 (three) times daily.     lisinopril (ZESTRIL) 10 MG tablet Take 10 mg by mouth daily.     Multiple Vitamin (MULTIVITAMIN WITH MINERALS) TABS tablet Take 1 tablet by mouth daily.     ondansetron (ZOFRAN ODT) 4 MG disintegrating tablet 4mg  ODT q4 hours prn nausea/vomit (Patient taking differently: Take 4 mg by mouth every 4 (four) hours as needed for nausea or vomiting (DISSOLVE ORALLY).) 20 tablet 0   oxyCODONE (ROXICODONE) 15 MG immediate release tablet Take 15-22.5 mg by mouth 5 (five) times daily as needed for pain.     No current facility-administered  medications for this visit.    PHYSICAL EXAMINATION: ECOG PERFORMANCE STATUS: 0 - Asymptomatic  Vitals:   03/10/23 1532  BP: (!) 163/72  Pulse: (!) 59  Resp: 18  Temp: 97.8 F (36.6 C)  SpO2: 100%   Wt Readings from Last 3 Encounters:  03/10/23 130 lb 8 oz (59.2 kg)  03/09/23 130 lb 1.6 oz (59 kg)  12/25/22 125 lb 10.6 oz (57 kg)     GENERAL:alert, no distress and comfortable SKIN: skin color normal, no rashes or significant lesions EYES: normal, Conjunctiva are pink and non-injected, sclera clear  NEURO: alert & oriented x 3 with fluent speech NECK: (-)supple, thyroid normal size, non-tender, without nodularity LYMPH: (-) no palpable lymphadenopathy in the cervical, axillary  ABDOMEN:(-) abdomen soft, (-) non-tender and (-) normal bowel sounds    LABORATORY DATA:  I have reviewed the data as listed    Latest Ref Rng & Units 12/25/2022    3:17 PM 08/22/2022    4:05 AM 08/21/2022    4:59 AM  CBC  WBC 4.0 - 10.5 K/uL 4.9  2.5  3.0   Hemoglobin 13.0 - 17.0 g/dL 40.9  9.0  8.9   Hematocrit 39.0 - 52.0 % 29.9  26.1  26.1   Platelets 150 - 400 K/uL 88  61  65         Latest Ref Rng & Units 12/25/2022    3:17 PM 08/22/2022    4:05 AM 08/21/2022    4:59 AM  CMP  Glucose 70 - 99 mg/dL 811  92  94   BUN 8 - 23 mg/dL 28  21  31    Creatinine 0.61 - 1.24 mg/dL 9.14  7.82  9.56   Sodium 135 - 145 mmol/L 138  141  140   Potassium 3.5 - 5.1 mmol/L 4.9  3.7  3.5   Chloride 98 - 111 mmol/L 109  113  111   CO2 22 - 32 mmol/L 24  23  22    Calcium 8.9 - 10.3 mg/dL 8.9  8.9  8.9   Total Protein 6.5 - 8.1 g/dL 7.6  6.2    Total Bilirubin 0.3 - 1.2 mg/dL 0.3  0.4    Alkaline Phos 38 - 126 U/L 69  54    AST 15 - 41 U/L 23  16    ALT 0 - 44 U/L 12  9        RADIOGRAPHIC STUDIES: I have personally reviewed the radiological images as listed and agreed with the findings in the report. No results found.    No orders of the defined types were placed in this encounter.  All  questions were answered. The patient knows to call the clinic with any problems, questions or concerns. No barriers to learning was detected. The total time spent in the appointment was 25 minutes.     Malachy Mood, MD 03/10/2023   Carolin Coy, CMA, am acting as scribe for Malachy Mood, MD.   I have reviewed the above documentation for accuracy and completeness, and I agree with the above.

## 2023-03-10 NOTE — Progress Notes (Signed)
Anesthesia Chart Review:  72 year old male with pertinent history of cirrhosis secondary to EtOH abuse and hepatitis C (treated) complicated by esophageal varices (small, grade 2 by endoscopy 12/2019) and hepatocellular carcinoma s/p radiation ablation 02/2020 and now in remission.  This is primarily followed by Annamarie Major, NP at the [facility] hepatology clinic. No history of ascites.  No history of variceal bleed.  Liver synthetic function is noted to be fairly well-preserved.  Patient was previously on nadolol but was stopped for unclear reasons, not restarted at last visit due to hypotension.  He was seen by hematologist/oncologist Dr. Mosetta Putt 03/10/2023 request of his PCP for preop evaluation.  Per note, "Surveillance liver MRI on January 09, 2023 showed unchanged subcapsular ablation defect of the medial posterior right lobe of the liver, hepatic segment VI, measuring 1.6 x 1.5 cm. No associated contrast enhancement. LI-RADS TR, nonviable. Multiple additional foci of arterial phase hyperenhancement measuring 1 cm or smaller are new, LI-RADS 3, will monitor, repeat liver MRI in August 2024.PLAN: - plan to call Neurosurgeon to give clearance - f/u as needed."  CMP and CBC 03/06/2023 from PCP office reviewed.  Notable for mild anemia with hemoglobin 11.5 and chronic thrombocytopenia with platelets 88k.  TSH also noted to be elevated at 7.030 however T4 was normal so no intervention was felt needed.  Labs otherwise unremarkable, creatinine 1.13, sodium 140, potassium 4.6, albumin 4.8, AST 23, ALT 10, T. bili <0.2, WBC 5.7 INR 1.1.  Copy of full results on patient chart.  EKG 12/25/2022: Sinus rhythm.  Rate 67.    Fernando Ryan [facility]/Anesthesiology Phone [phone] 03/10/2023 4:31 PM

## 2023-03-13 ENCOUNTER — Encounter: Payer: Self-pay | Admitting: Hematology

## 2023-03-16 ENCOUNTER — Encounter (HOSPITAL_COMMUNITY)
Admission: RE | Disposition: A | Discharge status: Home / Self Care | Payer: Self-pay | Source: Home / Self Care | Attending: Neurosurgery

## 2023-03-16 ENCOUNTER — Encounter (HOSPITAL_COMMUNITY): Payer: Self-pay | Admitting: Neurosurgery

## 2023-03-16 ENCOUNTER — Ambulatory Visit (HOSPITAL_BASED_OUTPATIENT_CLINIC_OR_DEPARTMENT_OTHER): Payer: Medicare HMO | Admitting: Certified Registered Nurse Anesthetist

## 2023-03-16 ENCOUNTER — Observation Stay (HOSPITAL_COMMUNITY)
Admission: RE | Admit: 2023-03-16 | Discharge: 2023-03-17 | Disposition: A | Discharge status: Home / Self Care | Payer: Medicare HMO | Attending: Neurosurgery | Admitting: Neurosurgery

## 2023-03-16 ENCOUNTER — Ambulatory Visit (HOSPITAL_COMMUNITY): Payer: Medicare HMO

## 2023-03-16 ENCOUNTER — Ambulatory Visit (HOSPITAL_COMMUNITY): Payer: Medicare HMO | Admitting: Vascular Surgery

## 2023-03-16 ENCOUNTER — Other Ambulatory Visit: Payer: Self-pay

## 2023-03-16 DIAGNOSIS — M4722 Other spondylosis with radiculopathy, cervical region: Secondary | ICD-10-CM | POA: Diagnosis not present

## 2023-03-16 DIAGNOSIS — B1920 Unspecified viral hepatitis C without hepatic coma: Secondary | ICD-10-CM

## 2023-03-16 DIAGNOSIS — M47892 Other spondylosis, cervical region: Secondary | ICD-10-CM | POA: Diagnosis not present

## 2023-03-16 DIAGNOSIS — I10 Essential (primary) hypertension: Secondary | ICD-10-CM

## 2023-03-16 DIAGNOSIS — Z981 Arthrodesis status: Secondary | ICD-10-CM | POA: Diagnosis not present

## 2023-03-16 DIAGNOSIS — Z7982 Long term (current) use of aspirin: Secondary | ICD-10-CM | POA: Diagnosis not present

## 2023-03-16 DIAGNOSIS — Z87891 Personal history of nicotine dependence: Secondary | ICD-10-CM | POA: Diagnosis not present

## 2023-03-16 DIAGNOSIS — Z79899 Other long term (current) drug therapy: Secondary | ICD-10-CM | POA: Diagnosis not present

## 2023-03-16 DIAGNOSIS — M4802 Spinal stenosis, cervical region: Secondary | ICD-10-CM | POA: Diagnosis not present

## 2023-03-16 DIAGNOSIS — N289 Disorder of kidney and ureter, unspecified: Secondary | ICD-10-CM | POA: Diagnosis not present

## 2023-03-16 DIAGNOSIS — M47812 Spondylosis without myelopathy or radiculopathy, cervical region: Secondary | ICD-10-CM | POA: Diagnosis not present

## 2023-03-16 DIAGNOSIS — M75121 Complete rotator cuff tear or rupture of right shoulder, not specified as traumatic: Secondary | ICD-10-CM | POA: Diagnosis not present

## 2023-03-16 HISTORY — PX: ANTERIOR CERVICAL DECOMP/DISCECTOMY FUSION: SHX1161

## 2023-03-16 LAB — CBC WITH DIFFERENTIAL/PLATELET
Abs Immature Granulocytes: 0.01 10*3/uL (ref 0.00–0.07)
Basophils Absolute: 0 10*3/uL (ref 0.0–0.1)
Basophils Relative: 0 %
Eosinophils Absolute: 0.1 10*3/uL (ref 0.0–0.5)
Eosinophils Relative: 4 %
HCT: 32.8 % — ABNORMAL LOW (ref 39.0–52.0)
Hemoglobin: 11 g/dL — ABNORMAL LOW (ref 13.0–17.0)
Immature Granulocytes: 0 %
Lymphocytes Relative: 30 %
Lymphs Abs: 1 10*3/uL (ref 0.7–4.0)
MCH: 31.2 pg (ref 26.0–34.0)
MCHC: 33.5 g/dL (ref 30.0–36.0)
MCV: 92.9 fL (ref 80.0–100.0)
Monocytes Absolute: 0.3 10*3/uL (ref 0.1–1.0)
Monocytes Relative: 8 %
Neutro Abs: 1.9 10*3/uL (ref 1.7–7.7)
Neutrophils Relative %: 58 %
Platelets: 60 10*3/uL — ABNORMAL LOW (ref 150–400)
RBC: 3.53 MIL/uL — ABNORMAL LOW (ref 4.22–5.81)
RDW: 13.5 % (ref 11.5–15.5)
WBC: 3.4 10*3/uL — ABNORMAL LOW (ref 4.0–10.5)
nRBC: 0 % (ref 0.0–0.2)

## 2023-03-16 LAB — PREPARE PLATELET PHERESIS

## 2023-03-16 SURGERY — ANTERIOR CERVICAL DECOMPRESSION/DISCECTOMY FUSION 2 LEVELS
Anesthesia: General | Site: Spine Cervical

## 2023-03-16 MED ORDER — ROCURONIUM BROMIDE 10 MG/ML (PF) SYRINGE
PREFILLED_SYRINGE | INTRAVENOUS | Status: AC
  Filled 2023-03-16: qty 10

## 2023-03-16 MED ORDER — PROPOFOL 10 MG/ML IV BOLUS
INTRAVENOUS | Status: DC | PRN
  Administered 2023-03-16: 50 mg via INTRAVENOUS
  Administered 2023-03-16: 100 mg via INTRAVENOUS
  Administered 2023-03-16: 20 mg via INTRAVENOUS

## 2023-03-16 MED ORDER — BUPIVACAINE HCL 0.5 % IJ SOLN
INTRAMUSCULAR | Status: DC | PRN
  Administered 2023-03-16: 3 mL

## 2023-03-16 MED ORDER — CHLORHEXIDINE GLUCONATE 0.12 % MT SOLN
15.0000 mL | Freq: Once | OROMUCOSAL | Status: AC
  Administered 2023-03-16: 15 mL via OROMUCOSAL
  Filled 2023-03-16: qty 15

## 2023-03-16 MED ORDER — ROCURONIUM BROMIDE 10 MG/ML (PF) SYRINGE
PREFILLED_SYRINGE | INTRAVENOUS | Status: DC | PRN
  Administered 2023-03-16: 10 mg via INTRAVENOUS
  Administered 2023-03-16: 60 mg via INTRAVENOUS

## 2023-03-16 MED ORDER — SODIUM CHLORIDE 0.9% FLUSH
3.0000 mL | Freq: Two times a day (BID) | INTRAVENOUS | Status: DC
  Administered 2023-03-16: 3 mL via INTRAVENOUS

## 2023-03-16 MED ORDER — THROMBIN 5000 UNITS EX SOLR: OROMUCOSAL | Status: DC | PRN

## 2023-03-16 MED ORDER — CEFAZOLIN SODIUM-DEXTROSE 2-4 GM/100ML-% IV SOLN
2.0000 g | INTRAVENOUS | Status: AC
  Administered 2023-03-16: 2 g via INTRAVENOUS

## 2023-03-16 MED ORDER — POLYETHYLENE GLYCOL 3350 17 G PO PACK: 17.0000 g | PACK | Freq: Every day | ORAL | Status: DC | PRN

## 2023-03-16 MED ORDER — SENNA 8.6 MG PO TABS
1.0000 | ORAL_TABLET | Freq: Two times a day (BID) | ORAL | Status: DC
  Administered 2023-03-16 – 2023-03-17 (×3): 8.6 mg via ORAL
  Filled 2023-03-16 (×3): qty 1

## 2023-03-16 MED ORDER — PHENYLEPHRINE 80 MCG/ML (10ML) SYRINGE FOR IV PUSH (FOR BLOOD PRESSURE SUPPORT)
PREFILLED_SYRINGE | INTRAVENOUS | Status: AC
  Filled 2023-03-16: qty 10

## 2023-03-16 MED ORDER — FENTANYL CITRATE (PF) 250 MCG/5ML IJ SOLN
INTRAMUSCULAR | Status: AC
  Filled 2023-03-16: qty 5

## 2023-03-16 MED ORDER — GABAPENTIN 300 MG PO CAPS
300.0000 mg | ORAL_CAPSULE | Freq: Three times a day (TID) | ORAL | Status: DC
  Administered 2023-03-16 – 2023-03-17 (×3): 300 mg via ORAL
  Filled 2023-03-16 (×3): qty 1

## 2023-03-16 MED ORDER — MIDAZOLAM HCL 2 MG/2ML IJ SOLN
INTRAMUSCULAR | Status: AC
  Filled 2023-03-16: qty 2

## 2023-03-16 MED ORDER — HYDROMORPHONE HCL 1 MG/ML IJ SOLN: 0.2500 mg | INTRAMUSCULAR | Status: DC | PRN

## 2023-03-16 MED ORDER — PHENYLEPHRINE HCL-NACL 20-0.9 MG/250ML-% IV SOLN
INTRAVENOUS | Status: DC | PRN
  Administered 2023-03-16: 30 ug/min via INTRAVENOUS

## 2023-03-16 MED ORDER — PANTOPRAZOLE SODIUM 40 MG IV SOLR: 40.0000 mg | Freq: Every day | INTRAVENOUS | Status: DC

## 2023-03-16 MED ORDER — ONDANSETRON HCL 4 MG PO TABS: 4.0000 mg | ORAL_TABLET | Freq: Four times a day (QID) | ORAL | Status: DC | PRN

## 2023-03-16 MED ORDER — LIDOCAINE-EPINEPHRINE 1 %-1:100000 IJ SOLN
INTRAMUSCULAR | Status: AC
  Filled 2023-03-16: qty 1

## 2023-03-16 MED ORDER — THROMBIN 20000 UNITS EX SOLR: CUTANEOUS | Status: DC | PRN

## 2023-03-16 MED ORDER — BUPIVACAINE HCL (PF) 0.5 % IJ SOLN
INTRAMUSCULAR | Status: AC
  Filled 2023-03-16: qty 30

## 2023-03-16 MED ORDER — SUGAMMADEX SODIUM 200 MG/2ML IV SOLN
INTRAVENOUS | Status: DC | PRN
  Administered 2023-03-16: 120 mg via INTRAVENOUS

## 2023-03-16 MED ORDER — THROMBIN 5000 UNITS EX SOLR
CUTANEOUS | Status: AC
  Filled 2023-03-16: qty 5000

## 2023-03-16 MED ORDER — LACTATED RINGERS IV SOLN: INTRAVENOUS | Status: DC

## 2023-03-16 MED ORDER — PROPOFOL 10 MG/ML IV BOLUS
INTRAVENOUS | Status: AC
  Filled 2023-03-16: qty 20

## 2023-03-16 MED ORDER — CHLORHEXIDINE GLUCONATE CLOTH 2 % EX PADS: 6.0000 | MEDICATED_PAD | Freq: Once | CUTANEOUS | Status: DC

## 2023-03-16 MED ORDER — LIDOCAINE-EPINEPHRINE 1 %-1:100000 IJ SOLN
INTRAMUSCULAR | Status: DC | PRN
  Administered 2023-03-16: 3 mL

## 2023-03-16 MED ORDER — PHENOL 1.4 % MT LIQD: 1.0000 | OROMUCOSAL | Status: DC | PRN

## 2023-03-16 MED ORDER — SODIUM CHLORIDE (PF) 0.9 % IJ SOLN
INTRAMUSCULAR | Status: AC
  Filled 2023-03-16: qty 10

## 2023-03-16 MED ORDER — SODIUM CHLORIDE 0.9 % IV SOLN: INTRAVENOUS | Status: DC

## 2023-03-16 MED ORDER — SODIUM CHLORIDE 0.9 % IV SOLN: 250.0000 mL | INTRAVENOUS | Status: DC

## 2023-03-16 MED ORDER — METHOCARBAMOL 1000 MG/10ML IJ SOLN: 500.0000 mg | Freq: Four times a day (QID) | INTRAVENOUS | Status: DC | PRN

## 2023-03-16 MED ORDER — ACETAMINOPHEN 325 MG PO TABS: 650.0000 mg | ORAL_TABLET | ORAL | Status: DC | PRN

## 2023-03-16 MED ORDER — ONDANSETRON HCL 4 MG/2ML IJ SOLN: 4.0000 mg | Freq: Four times a day (QID) | INTRAMUSCULAR | Status: DC | PRN

## 2023-03-16 MED ORDER — ADULT MULTIVITAMIN W/MINERALS CH
1.0000 | ORAL_TABLET | Freq: Every day | ORAL | Status: DC
  Administered 2023-03-16 – 2023-03-17 (×2): 1 via ORAL
  Filled 2023-03-16 (×2): qty 1

## 2023-03-16 MED ORDER — SODIUM CHLORIDE (PF) 0.9 % IJ SOLN
INTRAMUSCULAR | Status: AC
  Filled 2023-03-16: qty 20

## 2023-03-16 MED ORDER — ONDANSETRON HCL 4 MG/2ML IJ SOLN
INTRAMUSCULAR | Status: AC
  Filled 2023-03-16: qty 2

## 2023-03-16 MED ORDER — 0.9 % SODIUM CHLORIDE (POUR BTL) OPTIME
TOPICAL | Status: DC | PRN
  Administered 2023-03-16: 1000 mL

## 2023-03-16 MED ORDER — ACETAMINOPHEN 650 MG RE SUPP: 650.0000 mg | RECTAL | Status: DC | PRN

## 2023-03-16 MED ORDER — SODIUM CHLORIDE 0.9% FLUSH: 3.0000 mL | INTRAVENOUS | Status: DC | PRN

## 2023-03-16 MED ORDER — LIDOCAINE 2% (20 MG/ML) 5 ML SYRINGE
INTRAMUSCULAR | Status: AC
  Filled 2023-03-16: qty 5

## 2023-03-16 MED ORDER — OXYCODONE HCL 5 MG PO TABS
15.0000 mg | ORAL_TABLET | Freq: Every day | ORAL | Status: DC | PRN
  Administered 2023-03-16 – 2023-03-17 (×5): 15 mg via ORAL
  Filled 2023-03-16 (×5): qty 3

## 2023-03-16 MED ORDER — MORPHINE SULFATE (PF) 2 MG/ML IV SOLN: 2.0000 mg | INTRAVENOUS | Status: DC | PRN

## 2023-03-16 MED ORDER — CEFAZOLIN SODIUM-DEXTROSE 2-4 GM/100ML-% IV SOLN
2.0000 g | Freq: Three times a day (TID) | INTRAVENOUS | Status: AC
  Administered 2023-03-16 (×2): 2 g via INTRAVENOUS
  Filled 2023-03-16 (×2): qty 100

## 2023-03-16 MED ORDER — AMISULPRIDE (ANTIEMETIC) 5 MG/2ML IV SOLN: 10.0000 mg | Freq: Once | INTRAVENOUS | Status: DC | PRN

## 2023-03-16 MED ORDER — ORAL CARE MOUTH RINSE: 15.0000 mL | Freq: Once | OROMUCOSAL | Status: DC

## 2023-03-16 MED ORDER — BISACODYL 10 MG RE SUPP: 10.0000 mg | Freq: Every day | RECTAL | Status: DC | PRN

## 2023-03-16 MED ORDER — FENTANYL CITRATE (PF) 250 MCG/5ML IJ SOLN
INTRAMUSCULAR | Status: DC | PRN
  Administered 2023-03-16: 50 ug via INTRAVENOUS
  Administered 2023-03-16: 100 ug via INTRAVENOUS
  Administered 2023-03-16: 50 ug via INTRAVENOUS

## 2023-03-16 MED ORDER — DEXAMETHASONE SODIUM PHOSPHATE 10 MG/ML IJ SOLN
INTRAMUSCULAR | Status: DC | PRN
  Administered 2023-03-16: 10 mg via INTRAVENOUS

## 2023-03-16 MED ORDER — ONDANSETRON HCL 4 MG/2ML IJ SOLN
INTRAMUSCULAR | Status: DC | PRN
  Administered 2023-03-16: 4 mg via INTRAVENOUS

## 2023-03-16 MED ORDER — MENTHOL 3 MG MT LOZG
1.0000 | LOZENGE | OROMUCOSAL | Status: DC | PRN
  Filled 2023-03-16: qty 9

## 2023-03-16 MED ORDER — ORAL CARE MOUTH RINSE: 15.0000 mL | Freq: Once | OROMUCOSAL | Status: AC

## 2023-03-16 MED ORDER — FLEET ENEMA 7-19 GM/118ML RE ENEM: 1.0000 | ENEMA | Freq: Once | RECTAL | Status: DC | PRN

## 2023-03-16 MED ORDER — DEXAMETHASONE SODIUM PHOSPHATE 10 MG/ML IJ SOLN
INTRAMUSCULAR | Status: AC
  Filled 2023-03-16: qty 1

## 2023-03-16 MED ORDER — LIDOCAINE 2% (20 MG/ML) 5 ML SYRINGE
INTRAMUSCULAR | Status: DC | PRN
  Administered 2023-03-16: 80 mg via INTRAVENOUS

## 2023-03-16 MED ORDER — DOCUSATE SODIUM 100 MG PO CAPS
100.0000 mg | ORAL_CAPSULE | Freq: Two times a day (BID) | ORAL | Status: DC
  Administered 2023-03-16 – 2023-03-17 (×3): 100 mg via ORAL
  Filled 2023-03-16 (×3): qty 1

## 2023-03-16 MED ORDER — SUCCINYLCHOLINE CHLORIDE 200 MG/10ML IV SOSY
PREFILLED_SYRINGE | INTRAVENOUS | Status: DC | PRN
  Administered 2023-03-16: 110 mg via INTRAVENOUS

## 2023-03-16 MED ORDER — CHLORHEXIDINE GLUCONATE 0.12 % MT SOLN: 15.0000 mL | Freq: Once | OROMUCOSAL | Status: DC

## 2023-03-16 MED ORDER — PANTOPRAZOLE SODIUM 40 MG PO TBEC
40.0000 mg | DELAYED_RELEASE_TABLET | Freq: Every day | ORAL | Status: DC
  Administered 2023-03-16: 40 mg via ORAL
  Filled 2023-03-16: qty 1

## 2023-03-16 MED ORDER — MIDAZOLAM HCL 2 MG/2ML IJ SOLN
INTRAMUSCULAR | Status: DC | PRN
  Administered 2023-03-16: 1 mg via INTRAVENOUS

## 2023-03-16 MED ORDER — LISINOPRIL 10 MG PO TABS
10.0000 mg | ORAL_TABLET | Freq: Every day | ORAL | Status: DC
  Administered 2023-03-17: 10 mg via ORAL
  Filled 2023-03-16 (×2): qty 1

## 2023-03-16 MED ORDER — METHOCARBAMOL 500 MG PO TABS
500.0000 mg | ORAL_TABLET | Freq: Four times a day (QID) | ORAL | Status: DC | PRN
  Administered 2023-03-16 – 2023-03-17 (×3): 500 mg via ORAL
  Filled 2023-03-16 (×3): qty 1

## 2023-03-16 MED ORDER — THROMBIN 20000 UNITS EX SOLR
CUTANEOUS | Status: AC
  Filled 2023-03-16: qty 20000

## 2023-03-16 MED ORDER — CEFAZOLIN SODIUM-DEXTROSE 2-4 GM/100ML-% IV SOLN
INTRAVENOUS | Status: AC
  Filled 2023-03-16: qty 100

## 2023-03-16 SURGICAL SUPPLY — 64 items
ADH SKN CLS APL DERMABOND .7 (GAUZE/BANDAGES/DRESSINGS) ×1
APL SKNCLS STERI-STRIP NONHPOA (GAUZE/BANDAGES/DRESSINGS)
BAG COUNTER SPONGE SURGICOUNT (BAG) ×1 IMPLANT
BAG SPNG CNTER NS LX DISP (BAG) ×1
BAND INSRT 18 STRL LF DISP RB (MISCELLANEOUS) ×2
BAND RUBBER #18 3X1/16 STRL (MISCELLANEOUS) ×2 IMPLANT
BENZOIN TINCTURE PRP APPL 2/3 (GAUZE/BANDAGES/DRESSINGS) IMPLANT
BLADE CLIPPER SURG (BLADE) IMPLANT
BLADE SURG 11 STRL SS (BLADE) ×1 IMPLANT
BLADE ULTRA TIP 2M (BLADE) IMPLANT
BNDG GAUZE DERMACEA FLUFF 4 (GAUZE/BANDAGES/DRESSINGS) IMPLANT
BNDG GZE DERMACEA 4 6PLY (GAUZE/BANDAGES/DRESSINGS)
BUR MATCHSTICK NEURO 3.0 LAGG (BURR) ×1 IMPLANT
CANISTER SUCT 3000ML PPV (MISCELLANEOUS) ×1 IMPLANT
DERMABOND ADVANCED .7 DNX12 (GAUZE/BANDAGES/DRESSINGS) ×1 IMPLANT
DEVICE ENDSKLTN TC NANOLCK 6MM (Cage) IMPLANT
DRAIN CHANNEL 10M FLAT 3/4 FLT (DRAIN) IMPLANT
DRAPE C-ARM 42X72 X-RAY (DRAPES) ×2 IMPLANT
DRAPE HALF SHEET 40X57 (DRAPES) IMPLANT
DRAPE LAPAROTOMY 100X72 PEDS (DRAPES) ×1 IMPLANT
DRAPE MICROSCOPE SLANT 54X150 (MISCELLANEOUS) ×1 IMPLANT
DRSG OPSITE 4X5.5 SM (GAUZE/BANDAGES/DRESSINGS) ×2 IMPLANT
DRSG OPSITE POSTOP 3X4 (GAUZE/BANDAGES/DRESSINGS) ×2 IMPLANT
DRSG OPSITE POSTOP 4X6 (GAUZE/BANDAGES/DRESSINGS) IMPLANT
DURAPREP 6ML APPLICATOR 50/CS (WOUND CARE) ×1 IMPLANT
ELECT COATED BLADE 2.86 ST (ELECTRODE) ×1 IMPLANT
ELECT REM PT RETURN 9FT ADLT (ELECTROSURGICAL) ×1
ELECTRODE REM PT RTRN 9FT ADLT (ELECTROSURGICAL) ×1 IMPLANT
ENDOSKELETON TC NANOLOCK 6MM (Cage) ×2 IMPLANT
EVACUATOR SILICONE 100CC (DRAIN) IMPLANT
GAUZE 4X4 16PLY ~~LOC~~+RFID DBL (SPONGE) IMPLANT
GLOVE BIOGEL PI IND STRL 7.5 (GLOVE) ×1 IMPLANT
GLOVE ECLIPSE 7.0 STRL STRAW (GLOVE) ×2 IMPLANT
GLOVE EXAM NITRILE XL STR (GLOVE) IMPLANT
GOWN STRL REUS W/ TWL LRG LVL3 (GOWN DISPOSABLE) ×2 IMPLANT
GOWN STRL REUS W/ TWL XL LVL3 (GOWN DISPOSABLE) IMPLANT
GOWN STRL REUS W/TWL 2XL LVL3 (GOWN DISPOSABLE) IMPLANT
GOWN STRL REUS W/TWL LRG LVL3 (GOWN DISPOSABLE) ×2
GOWN STRL REUS W/TWL XL LVL3 (GOWN DISPOSABLE)
HEMOSTAT POWDER KIT SURGIFOAM (HEMOSTASIS) ×1 IMPLANT
KIT BASIN OR (CUSTOM PROCEDURE TRAY) ×1 IMPLANT
KIT TURNOVER KIT B (KITS) ×1 IMPLANT
NDL SPNL 22GX3.5 QUINCKE BK (NEEDLE) ×1 IMPLANT
NEEDLE HYPO 22GX1.5 SAFETY (NEEDLE) ×1 IMPLANT
NEEDLE SPNL 22GX3.5 QUINCKE BK (NEEDLE) ×1 IMPLANT
NS IRRIG 1000ML POUR BTL (IV SOLUTION) ×1 IMPLANT
PACK LAMINECTOMY NEURO (CUSTOM PROCEDURE TRAY) ×1 IMPLANT
PAD ARMBOARD 7.5X6 YLW CONV (MISCELLANEOUS) ×3 IMPLANT
PLATE ZEVO 2LVL 35MM (Plate) IMPLANT
PUTTY DBF 1CC CORTICAL FIBERS (Putty) IMPLANT
SCREW 3.5 SELFDRILL 15MM VARI (Screw) IMPLANT
SOL ELECTROSURG ANTI STICK (MISCELLANEOUS) ×1
SOLUTION ELECTROSURG ANTI STCK (MISCELLANEOUS) ×1 IMPLANT
SPIKE FLUID TRANSFER (MISCELLANEOUS) ×2 IMPLANT
SPONGE INTESTINAL PEANUT (DISPOSABLE) ×1 IMPLANT
SPONGE SURGIFOAM ABS GEL 100 (HEMOSTASIS) ×1 IMPLANT
STRIP CLOSURE SKIN 1/2X4 (GAUZE/BANDAGES/DRESSINGS) IMPLANT
SUT ETHILON 3 0 FSL (SUTURE) IMPLANT
SUT VIC AB 3-0 SH 8-18 (SUTURE) ×1 IMPLANT
SUT VICRYL 3-0 RB1 18 ABS (SUTURE) ×1 IMPLANT
TAPE CLOTH 3X10 TAN LF (GAUZE/BANDAGES/DRESSINGS) ×1 IMPLANT
TOWEL GREEN STERILE (TOWEL DISPOSABLE) ×1 IMPLANT
TOWEL GREEN STERILE FF (TOWEL DISPOSABLE) ×1 IMPLANT
WATER STERILE IRR 1000ML POUR (IV SOLUTION) ×1 IMPLANT

## 2023-03-16 NOTE — Progress Notes (Signed)
Dr. Conchita Paris made aware that patient's platelet count is 60. Verbal order received to give one unit of platelets. Dr. Renold Don also made aware.

## 2023-03-16 NOTE — Op Note (Signed)
NEUROSURGERY OPERATIVE NOTE   PREOP DIAGNOSIS: Cervical Spondylosis, C4-5, C5-6  POSTOP DIAGNOSIS: Same  PROCEDURE: 1. Discectomy at C4-5 C5-6 for decompression of spinal cord and exiting nerve roots  2. Placement of intervertebral biomechanical device, Medtronic Titan interbody cages 3. Placement of anterior instrumentation consisting of interbody plate and screws spanning C4-C6 - Medtronic Zevo 37mm plate and 15mm screws 4. Use of morselized bone allograft  5. Arthrodesis C4-5, C5-6, anterior interbody technique  6. Use of intraoperative microscope  SURGEON: Dr. Lisbeth Renshaw, MD  ASSISTANT: Dr. Coletta Memos, MD  ANESTHESIA: General Endotracheal  EBL: 100cc  SPECIMENS: None  DRAINS: None  COMPLICATIONS: None immediate  CONDITION: Hemodynamically stable to PACU  HISTORY: Fernando Ryan is a 72 y.o. man initially followed in the outpatient neurosurgery clinic complaining primarily of neck and right-sided shoulder pain, with significant weakness in the right arm.  He does also have a concomitant right rotator cuff tear.  He has attempted multiple different conservative treatments for his neck without significant improvement in his neck or shoulder/arm pain.  He therefore elected to proceed with surgical decompression and fusion.  The risks, benefits, and alternatives to surgery as well as the expected postoperative course and recovery were all reviewed in detail with the patient and his wife.  After all their questions were answered informed consent was obtained and witnessed.  PROCEDURE IN DETAIL: The patient was brought to the operating room and transferred to the operative table. After induction of general anesthesia, the patient was positioned on the operative table in the supine position with all pressure points meticulously padded. The skin of the neck was then prepped and draped in the usual sterile fashion.  After timeout was conducted, the skin was infiltrated with  local anesthetic. Skin incision was then made sharply and Bovie electrocautery was used to dissect the subcutaneous tissue until the platysma was identified. The platysma was then divided and undermined. The sternocleidomastoid muscle was then identified and, utilizing natural fascial planes in the neck, the prevertebral fascia was identified and the carotid sheath was retracted laterally and the trachea and esophagus retracted medially. Again using fluoroscopy, the correct disc spaces were identified. Bovie electrocautery was used to dissect in the subperiosteal plane and elevate the bilateral longus coli muscles. Table mounted retractors were then placed. At this point, the microscope was draped and brought into the field, and the remainder of the case was done under the microscope using microdissecting technique.  The C5-6 disc space was incised sharply and rongeurs were use to initially complete a discectomy. The high-speed drill was then used to complete discectomy until the posterior annulus was identified and removed and the posterior longitudinal ligament was identified. Using a nerve hook, the PLL was elevated, and Kerrison rongeurs were used to remove the posterior longitudinal ligament and the ventral thecal sac was identified. Using a combination of curettes and rongeurs, complete decompression of the thecal sac and exiting nerve roots at this level was completed, and verified using micro-nerve hook. This included removal of the posterior portion of the uncovertebral joint.  At this point, a 6mm medium interbody cage was sized and packed with morcellized bone allograft. This was then inserted and tapped into place.  Attention was then turned to the C4-5 level. In a similar fashion, discectomy was completed initially with curettes and rongeurs, and completed with the drill. The PLL was again identified, elevated and incised. Using Kerrison rongeurs, decompression of the spinal cord and exiting roots  was completed and confirmed  with a dissector.  A 6mm medium interbody cage was then sized and filled with bone allograft, and tapped into place.   After placement of the intervertebral devices, the anterior cervical plate was selected, and placed across the interspaces. Using a high-speed drill, the cortex of the cervical vertebral bodies was punctured, and screws inserted in the C4, C5, and C6 levels. Final fluoroscopic images in lateral projection were taken to confirm good hardware placement.  At this point, after all counts were verified to be correct, meticulous hemostasis was secured using a combination of bipolar electrocautery and passive hemostatics. The platysma muscle was then closed using interrupted 3-0 Vicryl sutures, and the skin was closed with a interrupted subcuticular stitch. Sterile dressings were then applied and the drapes removed.  The patient tolerated the procedure well and was extubated in the room and taken to the postanesthesia care unit in stable condition.   Lisbeth Renshaw, MD [facility]

## 2023-03-16 NOTE — H&P (Signed)
Chief Complaint   Neck and arm pain  History of Present Illness  Fernando Ryan is a 72 year old man I am seeing in follow-up. I have been seeing him for neck and bilateral shoulder pain, with primarily right-sided decreased range of motion. I had previously obtained MRI of the shoulder which did reveal a full-thickness tear of one of the rotator cuff muscles on the right. Patient has also undergone epidural steroid injection about a month ago. Unfortunately, he again only notes about 2-3 weeks of improvement after the injection. He has also seen his orthopedic doctor but was told that it would be better to treat his neck prior to treating his right rotator cuff. Unfortunately, he continues to complain of relatively severe pain in the neck with radiation into both shoulders.  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  He has been evaluated preoperatively by his hematologist, with a chronic thrombocytopenia.  Plan is to transfuse platelets this morning.  Past Medical History   Past Medical History:  Diagnosis Date   Arthritis    Cervical spondylosis    Cirrhosis (HCC)    Hepatitis    Hep C   Hypertension     Past Surgical History   Past Surgical History:  Procedure Laterality Date   COLONOSCOPY WITH PROPOFOL N/A 01/07/2020   Procedure: COLONOSCOPY WITH PROPOFOL;  Surgeon: Napoleon Form, MD;  Location: [facility];  Service: Endoscopy;  Laterality: N/A;   ESOPHAGOGASTRODUODENOSCOPY (EGD) WITH PROPOFOL N/A 01/07/2020   Procedure: ESOPHAGOGASTRODUODENOSCOPY (EGD) WITH PROPOFOL;  Surgeon: Napoleon Form, MD;  Location: [facility];  Service: Endoscopy;  Laterality: N/A;   FEMUR FRACTURE SURGERY Right    HEMOSTASIS CLIP PLACEMENT  01/07/2020   Procedure: HEMOSTASIS CLIP PLACEMENT;  Surgeon: Napoleon Form, MD;  Location: [facility];  Service: Endoscopy;;   IR RADIOLOGIST EVAL & MGMT  01/28/2020   IR RADIOLOGIST EVAL & MGMT  04/09/2020   IR RADIOLOGIST EVAL &  MGMT  08/26/2020   IR RADIOLOGIST EVAL & MGMT  06/08/2021   IR RADIOLOGIST EVAL & MGMT  12/03/2021   POLYPECTOMY  01/07/2020   Procedure: POLYPECTOMY;  Surgeon: Napoleon Form, MD;  Location: [facility];  Service: Endoscopy;;   RADIOLOGY WITH ANESTHESIA N/A 03/04/2020   Procedure: CT WITH ANESTHESIA  MICROWAVE ABLATION;  Surgeon: Malachy Moan, MD;  Location: [facility];  Service: Anesthesiology;  Laterality: N/A;    Social History   Social History   Tobacco Use   Smoking status: Former    Packs/day: 0.10    Years: 45.00    Additional pack years: 0.00    Total pack years: 4.50    Types: Cigarettes    Quit date: 03/02/2023    Years since quitting: 0.0   Smokeless tobacco: Never  Vaping Use   Vaping Use: Never used  Substance Use Topics   Alcohol use: Not Currently    Comment: quit 09/2019, previous 40 year history 12 drinks daily    Drug use: Not Currently    Medications   Prior to Admission medications   Medication Sig Start Date End Date Taking? Authorizing Provider  aspirin EC 81 MG tablet Take 81 mg by mouth daily. Swallow whole.   Yes [provider]  diclofenac Sodium (VOLTAREN) 1 % GEL Apply 1 Application topically 4 (four) times daily as needed (pain).   Yes [provider]  gabapentin (NEURONTIN) 300 MG capsule Take 300 mg by mouth 3 (three) times daily. 10/18/21  Yes [provider]  lisinopril (ZESTRIL) 10 MG tablet Take 10 mg by mouth daily.   Yes [provider]  Multiple Vitamin (MULTIVITAMIN WITH MINERALS) TABS tablet Take 1 tablet by mouth daily.   Yes [provider]  ondansetron (ZOFRAN ODT) 4 MG disintegrating tablet 4mg  ODT q4 hours prn nausea/vomit Patient taking differently: Take 4 mg by mouth every 4 (four) hours as needed for nausea or vomiting (DISSOLVE ORALLY). 02/03/21  Yes Melene Plan, DO  oxyCODONE (ROXICODONE) 15 MG immediate release tablet Take 15-22.5 mg by mouth 5 (five) times daily as needed for  pain.   Yes [provider]  acetaminophen (TYLENOL) 500 MG tablet Take 1,000 mg by mouth every 6 (six) hours as needed for moderate pain.    [provider]    Allergies  No Known Allergies  Review of Systems  ROS  Neurologic Exam  Awake, alert, oriented Memory and concentration grossly intact Speech fluent, appropriate CN grossly intact Motor exam: Upper Extremities Deltoid Bicep Tricep Grip  Right 3/5 2/5 4+/5 5/5  Left 5/5 5/5 5/5 5/5   Lower Extremities IP Quad PF DF EHL  Right 5/5 5/5 5/5 5/5 5/5  Left 5/5 5/5 5/5 5/5 5/5   Sensation grossly intact to LT  Imaging  MRI of the cervical spine does reveal multilevel cervical spondylosis including moderate to severe foraminal stenosis at C4-5 and C5-6.  Impression  - 72 y.o. male with severe neck and primarily right-sided shoulder pain and weakness related in part to cervical spondylosis including foraminal stenosis at C4-5 and C5-6.  Patient does also have concurrent rotator cuff tears.  He has undergone extensive conservative treatment for his cervical disease without significant improvement.  Plan  -We will plan on proceeding with anterior cervical discectomy and fusion C4-5 C5-6  I have reviewed the indications for the procedure as well as the details of the procedure and the expected postoperative course and recovery at length with the patient in the office. We have also reviewed in detail the risks, benefits, and alternatives to the procedure. All questions were answered and Fernando Ryan provided informed consent to proceed.  Lisbeth Renshaw, MD [facility]

## 2023-03-16 NOTE — H&P (Signed)
Please see progress note today @ 9:50am.

## 2023-03-16 NOTE — Transfer of Care (Signed)
Immediate Anesthesia Transfer of Care Note  Patient: Fernando Ryan  Procedure(s) Performed: CERIVAL FOUR-FIVE, CERVICAL FIVE-SIX ANTERIOR CERVICAL DECOMPRESSION/DISCECTOMY FUSION (Spine Cervical)  Patient Location: PACU  Anesthesia Type:General  Level of Consciousness: awake, alert , and patient cooperative  Airway & Oxygen Therapy: Patient Spontanous Breathing and Patient connected to face mask oxygen  Post-op Assessment: Report given to RN, Post -op Vital signs reviewed and stable, and Patient moving all extremities X 4  Post vital signs: Reviewed and stable  Last Vitals:  Vitals Value Taken Time  BP 162/87 03/16/23 1301  Temp    Pulse 82 03/16/23 1306  Resp 16 03/16/23 1306  SpO2 100 % 03/16/23 1306  Vitals shown include unvalidated device data.  Last Pain:  Vitals:   03/16/23 0932  TempSrc: Oral  PainSc:       Patients Stated Pain Goal: 1 (03/16/23 0819)  Complications: No notable events documented.

## 2023-03-16 NOTE — Anesthesia Procedure Notes (Addendum)
Arterial Line Insertion Start/End5/12/2022 9:50 AM, 03/16/2023 10:05 AM Performed by: Lewie Loron, MD, anesthesiologist  Patient location: Pre-op. Preanesthetic checklist: patient identified, IV checked, site marked, risks and benefits discussed, surgical consent, monitors and equipment checked, pre-op evaluation, timeout performed and anesthesia consent Lidocaine 1% used for infiltration radial was placed Catheter size: 20 G Hand hygiene performed , maximum sterile barriers used  and Seldinger technique used  Attempts: 2 Procedure performed using ultrasound guided technique. Ultrasound Notes:anatomy identified, needle tip was noted to be adjacent to the nerve/plexus identified, no ultrasound evidence of intravascular and/or intraneural injection and image(s) printed for medical record Following insertion, dressing applied and Biopatch. Post procedure assessment: normal and unchanged  Post procedure complications: unsuccessful attempts and second provider assisted. Patient tolerated the procedure well with no immediate complications.

## 2023-03-16 NOTE — Anesthesia Postprocedure Evaluation (Signed)
Anesthesia Post Note  Patient: Fernando Ryan  Procedure(s) Performed: CERIVAL FOUR-FIVE, CERVICAL FIVE-SIX ANTERIOR CERVICAL DECOMPRESSION/DISCECTOMY FUSION (Spine Cervical)     Patient location during evaluation: PACU Anesthesia Type: General Level of consciousness: sedated and patient cooperative Pain management: pain level controlled Vital Signs Assessment: post-procedure vital signs reviewed and stable Respiratory status: spontaneous breathing Cardiovascular status: stable Anesthetic complications: no   No notable events documented.  Last Vitals:  Vitals:   03/16/23 1345 03/16/23 1414  BP: (!) 157/76 (!) 171/77  Pulse: 72 70  Resp: 13 18  Temp:  (!) 36.4 C  SpO2: 98% 100%    Last Pain:  Vitals:   03/16/23 1300  TempSrc:   PainSc: 0-No pain                 Lewie Loron

## 2023-03-16 NOTE — Progress Notes (Signed)
Orthopedic Tech Progress Note Patient Details:  Fernando Ryan Jan 23, 1951 [id number]  Adult aspen collar is too large for the pt. Order for a smaller aspen collar has been called into [employer].  Patient ID: Fernando Ryan, male   DOB: [birth year], 72 y.o.   MRN: [id number]  Docia Furl 03/16/2023, 6:53 PM

## 2023-03-16 NOTE — Addendum Note (Signed)
Addendum  created 03/16/23 1617 by Lewie Loron, MD   Clinical Note Signed, Intraprocedure Blocks edited

## 2023-03-16 NOTE — Anesthesia Procedure Notes (Signed)
Procedure Name: Intubation Date/Time: 03/16/2023 10:31 AM  Performed by: Audie Pinto, CRNAPre-anesthesia Checklist: Patient identified, Emergency Drugs available, Suction available and Patient being monitored Patient Re-evaluated:Patient Re-evaluated prior to induction Oxygen Delivery Method: Circle system utilized Preoxygenation: Pre-oxygenation with 100% oxygen Induction Type: IV induction Ventilation: Mask ventilation without difficulty and Oral airway inserted - appropriate to patient size Laryngoscope Size: Glidescope and 3 Grade View: Grade I Tube type: Oral Tube size: 7.0 mm Number of attempts: 1 Airway Equipment and Method: Stylet and Oral airway Placement Confirmation: ETT inserted through vocal cords under direct vision, positive ETCO2 and breath sounds checked- equal and bilateral Secured at: 21 cm Tube secured with: Tape Dental Injury: Teeth and Oropharynx as per pre-operative assessment  Comments: Elective glidescope - ACDF

## 2023-03-17 ENCOUNTER — Encounter (HOSPITAL_COMMUNITY): Payer: Self-pay | Admitting: Neurosurgery

## 2023-03-17 DIAGNOSIS — Z87891 Personal history of nicotine dependence: Secondary | ICD-10-CM | POA: Diagnosis not present

## 2023-03-17 DIAGNOSIS — M75121 Complete rotator cuff tear or rupture of right shoulder, not specified as traumatic: Secondary | ICD-10-CM | POA: Diagnosis not present

## 2023-03-17 DIAGNOSIS — I10 Essential (primary) hypertension: Secondary | ICD-10-CM | POA: Diagnosis not present

## 2023-03-17 DIAGNOSIS — Z79899 Other long term (current) drug therapy: Secondary | ICD-10-CM | POA: Diagnosis not present

## 2023-03-17 DIAGNOSIS — Z7982 Long term (current) use of aspirin: Secondary | ICD-10-CM | POA: Diagnosis not present

## 2023-03-17 DIAGNOSIS — M4722 Other spondylosis with radiculopathy, cervical region: Secondary | ICD-10-CM | POA: Diagnosis not present

## 2023-03-17 DIAGNOSIS — M4802 Spinal stenosis, cervical region: Secondary | ICD-10-CM | POA: Diagnosis not present

## 2023-03-17 LAB — TYPE AND SCREEN: Antibody Screen: NEGATIVE

## 2023-03-17 LAB — BPAM PLATELET PHERESIS
Blood Product Expiration Date: 202405032359
ISSUE DATE / TIME: 202405020909
Unit Type and Rh: 5100

## 2023-03-17 LAB — PREPARE PLATELET PHERESIS: Unit division: 0

## 2023-03-17 MED ORDER — OXYCODONE-ACETAMINOPHEN 5-325 MG PO TABS: 1.0000 | ORAL_TABLET | Freq: Four times a day (QID) | ORAL | 0 refills | Status: AC | PRN

## 2023-03-17 MED ORDER — DOCUSATE SODIUM 100 MG PO CAPS: 100.0000 mg | ORAL_CAPSULE | Freq: Two times a day (BID) | ORAL | 2 refills | Status: AC

## 2023-03-17 NOTE — Discharge Summary (Signed)
  Physician Discharge Summary  Patient ID: Fernando Ryan MRN: [id number] DOB/AGE: [birth year] 73 y.o.  Admit date: 03/16/2023 Discharge date: 03/17/2023  Admission Diagnoses:  Cervical radiculopathy  Discharge Diagnoses:  Same Principal Problem:   Cervical spondylosis with radiculopathy   Discharged Condition: Stable  Hospital Course:  Fernando Ryan is a 72 y.o. male who underwent elective C4-5 and C5-6 ACDF.  Postoperatively, he was admitted to the spine unit where he was mobilized.  He was tolerating a regular diet and was deemed ready for discharge on 03/17/2023  Treatments: Surgery -C4-5 and C5-6 ACDF  Discharge Exam: Blood pressure 127/63, pulse 76, temperature 98.1 F (36.7 C), temperature source Oral, resp. rate 18, height 5\' 3"  (1.6 m), weight 59.2 kg, SpO2 100 %. Awake, alert, oriented Speech fluent, appropriate CN grossly intact 5/5 BUE/BLE except 4-/5 in RUE Wound c/d/i  Disposition: Discharge disposition: 01-Home or Self Care       Discharge Instructions     Incentive spirometry RT   Complete by: As directed       Allergies as of 03/17/2023   No Known Allergies      Medication List     TAKE these medications    acetaminophen 500 MG tablet Commonly known as: TYLENOL Take 1,000 mg by mouth every 6 (six) hours as needed for moderate pain.   aspirin EC 81 MG tablet Take 81 mg by mouth daily. Swallow whole.   diclofenac Sodium 1 % Gel Commonly known as: VOLTAREN Apply 1 Application topically 4 (four) times daily as needed (pain).   docusate sodium 100 MG capsule Commonly known as: Colace Take 1 capsule (100 mg total) by mouth 2 (two) times daily.   gabapentin 300 MG capsule Commonly known as: NEURONTIN Take 300 mg by mouth 3 (three) times daily.   lisinopril 10 MG tablet Commonly known as: ZESTRIL Take 10 mg by mouth daily.   multivitamin with minerals Tabs tablet Take 1 tablet by mouth daily.   ondansetron 4 MG disintegrating  tablet Commonly known as: Zofran ODT 4mg  ODT q4 hours prn nausea/vomit What changed:  how much to take how to take this when to take this reasons to take this additional instructions   oxyCODONE 15 MG immediate release tablet Commonly known as: ROXICODONE Take 15-22.5 mg by mouth 5 (five) times daily as needed for pain.   oxyCODONE-acetaminophen 5-325 MG tablet Commonly known as: Percocet Take 1 tablet by mouth every 6 (six) hours as needed for severe pain.        Follow-up Information     Lisbeth Renshaw, MD. Call.   Specialty: Neurosurgery Why: As needed, If symptoms worsen Contact information: [address] [place] [address] [phone]                 Signed: Bedelia Person 03/17/2023, 10:55 AM

## 2023-03-17 NOTE — Progress Notes (Signed)
PT Cancellation Note  Patient Details Name: Fernando Ryan MRN: [id number] DOB: 1950/11/26   Cancelled Treatment:    Reason Eval/Treat Not Completed: PT screened, no needs identified, will sign off. Discussed pt case with OT who reports pt is currently mobilizing without assistance and does not require a formal PT evaluation at this time. PT signing off. If needs change, please reconsult.     Marylynn Pearson 03/17/2023, 9:29 AM  Conni Slipper, PT, DPT Acute Rehabilitation Services Secure Chat Preferred Office: [phone]

## 2023-03-17 NOTE — Progress Notes (Signed)
Patient alert and oriented, voiding adequately, MAE well with no difficulty. Incision area cdi with no s/s of infection. Patient discharged home per order. Patient stated understanding of discharge instructions given. Patient has an appointment with Dr. Conchita Paris. Patient awaiting family for ride home.

## 2023-03-17 NOTE — Discharge Instructions (Addendum)
Wound Care Remove outer dressing in 3 days Leave incision open to air. You may shower. Do not scrub directly on incision.  Do not put any creams, lotions, or ointments on incision. Activity Walk each and every day, increasing distance each day. No lifting greater than 8 lbs.  Avoid excessive neck motion. No driving for 2 weeks; may ride as a passenger locally. Wear neck brace at all times except when showering.   Diet Resume your normal diet.   Call Your Doctor If Any of These Occur Redness, drainage, or swelling at the wound.  Temperature greater than 101 degrees. Severe pain not relieved by pain medication. Increased difficulty swallowing. Incision starts to come apart. Follow Up Appt Call  [phone]) for problems.  If you have any hardware placed in your spine, you will need an x-ray before your appointment.

## 2023-03-17 NOTE — Evaluation (Addendum)
Occupational Therapy Evaluation and DC Summary Patient Details Name: Fernando Ryan MRN: [id number] DOB: 12-Aug-1951 Today's Date: 03/17/2023   History of Present Illness 72 year old man presenting with neck and bilateral shoulder pain, with primarily right-sided decreased range of motion. He is now s/p C4-C5 and C5-C6 anterior cervical decompression/discectomy fusion.   Clinical Impression   Pt admitted for above dx, PTA patient lived with daughter and was independent in functional ambulation and bADLs while his daugther assisted with iADLs. Educated patient on cervical precautions and patient verbalized and demonstrated understanding of precautions with bathing, dressing, transfers, and grooming. Pt successfully completed one flight of stairs with min guard assist for safety. Pt ambulating and complete bADLs with Mod I, presenting close to functional baseline. Pt has no further acute skilled OT needs, no follow-up OT needed at this time.       Recommendations for follow up therapy are one component of a multi-disciplinary discharge planning process, led by the attending physician.  Recommendations may be updated based on patient status, additional functional criteria and insurance authorization.   Assistance Recommended at Discharge PRN  Patient can return home with the following Assistance with cooking/housework;Assist for transportation    Functional Status Assessment  Patient has not had a recent decline in their functional status  Equipment Recommendations  None recommended by OT    Recommendations for Other Services       Precautions / Restrictions Precautions Precautions: Cervical Precaution Booklet Issued: Yes (comment) Precaution Comments: Pt verbally recalled all post op neck precautions and demonstrated understanding Required Braces or Orthoses: Cervical Brace Cervical Brace: Hard collar (when OOB) Restrictions Weight Bearing Restrictions: No      Mobility Bed  Mobility Overal bed mobility: Modified Independent             General bed mobility comments: Pt able to complete sit>supine t/f safely with use of log roll    Transfers Overall transfer level: Modified independent                 General transfer comment: 1 verbal cue to keep neck upright instead of dependent position      Balance Overall balance assessment: Mild deficits observed, not formally tested                                         ADL either performed or assessed with clinical judgement   ADL Overall ADL's : Modified independent;At baseline                                       General ADL Comments: Pt demonstrated ability to perform sinkside grooming, functional transfers, full body dressing, using compensatory strategies prn     Vision         Perception     Praxis      Pertinent Vitals/Pain Pain Assessment Pain Assessment: Faces Pain Score: 4  Pain Location: neck, incision site and with swallowing Pain Descriptors / Indicators: Constant, Grimacing, Aching Pain Intervention(s): Limited activity within patient's tolerance, Monitored during session     Hand Dominance     Extremity/Trunk Assessment Upper Extremity Assessment Upper Extremity Assessment: Overall WFL for tasks assessed   Lower Extremity Assessment Lower Extremity Assessment: Overall WFL for tasks assessed   Cervical / Trunk Assessment Cervical / Trunk Assessment: Neck Surgery  Communication Communication Communication: No difficulties   Cognition Arousal/Alertness: Awake/alert Behavior During Therapy: WFL for tasks assessed/performed Overall Cognitive Status: Within Functional Limits for tasks assessed                                       General Comments  VSS on RA    Exercises     Shoulder Instructions      Home Living Family/patient expects to be discharged to:: Private residence Living Arrangements:  Children Available Help at Discharge: Family;Available 24 hours/day Type of Home: House Home Access: Stairs to enter [employer] of Steps: 3 Entrance Stairs-Rails: None Home Layout: One level     Bathroom Shower/Tub: Chief Strategy Officer:  (17" toilet)     Home Equipment: Cane - single point;Rollator (4 wheels);Crutches   Additional Comments: Pt reports he has a stool that can act as a shower chair      Prior Functioning/Environment Prior Level of Function : Independent/Modified Independent             Mobility Comments: Pt reports being independent no AD ADLs Comments: independent in bADLs, duaghter performs iADLs        OT Problem List: Pain      OT Treatment/Interventions:      OT Goals(Current goals can be found in the care plan section) Acute Rehab OT Goals Patient Stated Goal: to go home OT Goal Formulation: With patient Time For Goal Achievement: 03/31/23 Potential to Achieve Goals: Good  OT Frequency:      Co-evaluation              AM-PAC OT "6 Clicks" Daily Activity     Outcome Measure Help from another person eating meals?: None Help from another person taking care of personal grooming?: None Help from another person toileting, which includes using toliet, bedpan, or urinal?: None Help from another person bathing (including washing, rinsing, drying)?: None Help from another person to put on and taking off regular upper body clothing?: None Help from another person to put on and taking off regular lower body clothing?: None 6 Click Score: 24   End of Session Equipment Utilized During Treatment: Gait belt Nurse Communication: Mobility status  Activity Tolerance: Patient tolerated treatment well Patient left: in bed;with call bell/phone within reach  OT Visit Diagnosis: Pain Pain - part of body:  (neck)                Time: 1610-9604 OT Time Calculation (min): 25 min Charges:  OT General Charges $OT Visit: 1  Visit OT Evaluation $OT Eval Moderate Complexity: 1 Mod OT Treatments $Self Care/Home Management : 8-22 mins  03/17/2023  AB, OTR/L  Acute Rehabilitation Services  Office: [phone]   Tristan Schroeder 03/17/2023, 9:41 AM

## 2023-03-30 DIAGNOSIS — M25511 Pain in right shoulder: Secondary | ICD-10-CM | POA: Diagnosis not present

## 2023-03-30 DIAGNOSIS — G894 Chronic pain syndrome: Secondary | ICD-10-CM | POA: Diagnosis not present

## 2023-03-30 DIAGNOSIS — Z1389 Encounter for screening for other disorder: Secondary | ICD-10-CM | POA: Diagnosis not present

## 2023-03-30 DIAGNOSIS — M542 Cervicalgia: Secondary | ICD-10-CM | POA: Diagnosis not present

## 2023-04-14 DIAGNOSIS — M549 Dorsalgia, unspecified: Secondary | ICD-10-CM | POA: Diagnosis not present

## 2023-04-14 DIAGNOSIS — I10 Essential (primary) hypertension: Secondary | ICD-10-CM | POA: Diagnosis not present

## 2023-04-27 DIAGNOSIS — M542 Cervicalgia: Secondary | ICD-10-CM | POA: Diagnosis not present

## 2023-04-27 DIAGNOSIS — M25511 Pain in right shoulder: Secondary | ICD-10-CM | POA: Diagnosis not present

## 2023-04-27 DIAGNOSIS — G894 Chronic pain syndrome: Secondary | ICD-10-CM | POA: Diagnosis not present

## 2023-05-24 ENCOUNTER — Other Ambulatory Visit: Payer: Self-pay | Admitting: Nurse Practitioner

## 2023-05-24 DIAGNOSIS — E440 Moderate protein-calorie malnutrition: Secondary | ICD-10-CM | POA: Diagnosis not present

## 2023-05-24 DIAGNOSIS — C220 Liver cell carcinoma: Secondary | ICD-10-CM

## 2023-05-24 DIAGNOSIS — K7469 Other cirrhosis of liver: Secondary | ICD-10-CM | POA: Diagnosis not present

## 2023-05-24 DIAGNOSIS — I8510 Secondary esophageal varices without bleeding: Secondary | ICD-10-CM | POA: Diagnosis not present

## 2023-05-24 DIAGNOSIS — Z9189 Other specified personal risk factors, not elsewhere classified: Secondary | ICD-10-CM | POA: Diagnosis not present

## 2023-05-25 ENCOUNTER — Other Ambulatory Visit: Payer: Self-pay | Admitting: Nurse Practitioner

## 2023-05-25 DIAGNOSIS — M542 Cervicalgia: Secondary | ICD-10-CM | POA: Diagnosis not present

## 2023-05-25 DIAGNOSIS — M25511 Pain in right shoulder: Secondary | ICD-10-CM | POA: Diagnosis not present

## 2023-05-25 DIAGNOSIS — G894 Chronic pain syndrome: Secondary | ICD-10-CM | POA: Diagnosis not present

## 2023-05-25 DIAGNOSIS — C220 Liver cell carcinoma: Secondary | ICD-10-CM

## 2023-06-19 ENCOUNTER — Ambulatory Visit
Admission: RE | Admit: 2023-06-19 | Discharge: 2023-06-19 | Disposition: A | Discharge status: Home / Self Care | Payer: Medicare HMO | Source: Ambulatory Visit | Attending: Nurse Practitioner | Admitting: Nurse Practitioner

## 2023-06-19 DIAGNOSIS — I700 Atherosclerosis of aorta: Secondary | ICD-10-CM | POA: Diagnosis not present

## 2023-06-19 DIAGNOSIS — K7689 Other specified diseases of liver: Secondary | ICD-10-CM | POA: Diagnosis not present

## 2023-06-19 DIAGNOSIS — K8020 Calculus of gallbladder without cholecystitis without obstruction: Secondary | ICD-10-CM | POA: Diagnosis not present

## 2023-06-19 DIAGNOSIS — C220 Liver cell carcinoma: Secondary | ICD-10-CM

## 2023-06-19 MED ORDER — IOPAMIDOL (ISOVUE-300) INJECTION 61%
75.0000 mL | Freq: Once | INTRAVENOUS | Status: AC | PRN
  Administered 2023-06-19: 75 mL via INTRAVENOUS

## 2023-06-22 DIAGNOSIS — Z1389 Encounter for screening for other disorder: Secondary | ICD-10-CM | POA: Diagnosis not present

## 2023-06-22 DIAGNOSIS — G894 Chronic pain syndrome: Secondary | ICD-10-CM | POA: Diagnosis not present

## 2023-06-22 DIAGNOSIS — M542 Cervicalgia: Secondary | ICD-10-CM | POA: Diagnosis not present

## 2023-06-22 DIAGNOSIS — M25511 Pain in right shoulder: Secondary | ICD-10-CM | POA: Diagnosis not present

## 2023-07-19 ENCOUNTER — Inpatient Hospital Stay: Admission: RE | Admit: 2023-07-19 | Payer: Medicare HMO | Source: Ambulatory Visit

## 2023-07-19 DIAGNOSIS — M542 Cervicalgia: Secondary | ICD-10-CM | POA: Diagnosis not present

## 2023-07-19 DIAGNOSIS — M25511 Pain in right shoulder: Secondary | ICD-10-CM | POA: Diagnosis not present

## 2023-07-19 DIAGNOSIS — G894 Chronic pain syndrome: Secondary | ICD-10-CM | POA: Diagnosis not present

## 2023-08-17 DIAGNOSIS — M25511 Pain in right shoulder: Secondary | ICD-10-CM | POA: Diagnosis not present

## 2023-08-17 DIAGNOSIS — G894 Chronic pain syndrome: Secondary | ICD-10-CM | POA: Diagnosis not present

## 2023-08-17 DIAGNOSIS — M25512 Pain in left shoulder: Secondary | ICD-10-CM | POA: Diagnosis not present

## 2023-08-17 DIAGNOSIS — M542 Cervicalgia: Secondary | ICD-10-CM | POA: Diagnosis not present

## 2023-08-28 DIAGNOSIS — R490 Dysphonia: Secondary | ICD-10-CM | POA: Diagnosis not present

## 2023-08-28 DIAGNOSIS — R051 Acute cough: Secondary | ICD-10-CM | POA: Diagnosis not present

## 2023-08-28 DIAGNOSIS — J040 Acute laryngitis: Secondary | ICD-10-CM | POA: Diagnosis not present

## 2023-08-28 DIAGNOSIS — R0981 Nasal congestion: Secondary | ICD-10-CM | POA: Diagnosis not present

## 2023-09-14 DIAGNOSIS — G894 Chronic pain syndrome: Secondary | ICD-10-CM | POA: Diagnosis not present

## 2023-09-14 DIAGNOSIS — M25511 Pain in right shoulder: Secondary | ICD-10-CM | POA: Diagnosis not present

## 2023-09-14 DIAGNOSIS — M542 Cervicalgia: Secondary | ICD-10-CM | POA: Diagnosis not present

## 2023-09-14 DIAGNOSIS — M25512 Pain in left shoulder: Secondary | ICD-10-CM | POA: Diagnosis not present

## 2023-10-10 DIAGNOSIS — M545 Low back pain, unspecified: Secondary | ICD-10-CM | POA: Diagnosis not present

## 2023-10-10 DIAGNOSIS — R519 Headache, unspecified: Secondary | ICD-10-CM | POA: Diagnosis not present

## 2023-10-10 DIAGNOSIS — I10 Essential (primary) hypertension: Secondary | ICD-10-CM | POA: Diagnosis not present

## 2023-10-10 DIAGNOSIS — Z6822 Body mass index (BMI) 22.0-22.9, adult: Secondary | ICD-10-CM | POA: Diagnosis not present

## 2023-10-19 DIAGNOSIS — M25511 Pain in right shoulder: Secondary | ICD-10-CM | POA: Diagnosis not present

## 2023-10-19 DIAGNOSIS — M542 Cervicalgia: Secondary | ICD-10-CM | POA: Diagnosis not present

## 2023-10-19 DIAGNOSIS — G894 Chronic pain syndrome: Secondary | ICD-10-CM | POA: Diagnosis not present

## 2023-10-19 DIAGNOSIS — M25512 Pain in left shoulder: Secondary | ICD-10-CM | POA: Diagnosis not present

## 2023-12-08 DIAGNOSIS — M542 Cervicalgia: Secondary | ICD-10-CM | POA: Diagnosis not present

## 2023-12-08 DIAGNOSIS — Z760 Encounter for issue of repeat prescription: Secondary | ICD-10-CM | POA: Diagnosis not present

## 2023-12-08 DIAGNOSIS — M25551 Pain in right hip: Secondary | ICD-10-CM | POA: Diagnosis not present

## 2023-12-08 DIAGNOSIS — Z6822 Body mass index (BMI) 22.0-22.9, adult: Secondary | ICD-10-CM | POA: Diagnosis not present

## 2023-12-08 DIAGNOSIS — G8929 Other chronic pain: Secondary | ICD-10-CM | POA: Diagnosis not present

## 2023-12-08 DIAGNOSIS — M549 Dorsalgia, unspecified: Secondary | ICD-10-CM | POA: Diagnosis not present

## 2023-12-08 DIAGNOSIS — R490 Dysphonia: Secondary | ICD-10-CM | POA: Diagnosis not present

## 2024-01-11 DIAGNOSIS — J069 Acute upper respiratory infection, unspecified: Secondary | ICD-10-CM | POA: Diagnosis not present

## 2024-01-11 DIAGNOSIS — Z79899 Other long term (current) drug therapy: Secondary | ICD-10-CM | POA: Diagnosis not present

## 2024-01-11 DIAGNOSIS — R0602 Shortness of breath: Secondary | ICD-10-CM | POA: Diagnosis not present

## 2024-01-11 DIAGNOSIS — Z20822 Contact with and (suspected) exposure to covid-19: Secondary | ICD-10-CM | POA: Diagnosis not present

## 2024-01-11 DIAGNOSIS — N390 Urinary tract infection, site not specified: Secondary | ICD-10-CM | POA: Diagnosis not present

## 2024-03-08 DIAGNOSIS — G894 Chronic pain syndrome: Secondary | ICD-10-CM | POA: Diagnosis not present

## 2024-03-08 DIAGNOSIS — M79641 Pain in right hand: Secondary | ICD-10-CM | POA: Diagnosis not present

## 2024-03-08 DIAGNOSIS — Z79891 Long term (current) use of opiate analgesic: Secondary | ICD-10-CM | POA: Diagnosis not present

## 2024-03-08 DIAGNOSIS — M545 Low back pain, unspecified: Secondary | ICD-10-CM | POA: Diagnosis not present

## 2024-03-08 DIAGNOSIS — M25511 Pain in right shoulder: Secondary | ICD-10-CM | POA: Diagnosis not present

## 2024-03-27 DIAGNOSIS — G522 Disorders of vagus nerve: Secondary | ICD-10-CM | POA: Diagnosis not present

## 2024-03-27 DIAGNOSIS — M5412 Radiculopathy, cervical region: Secondary | ICD-10-CM | POA: Diagnosis not present

## 2024-04-03 DIAGNOSIS — H6123 Impacted cerumen, bilateral: Secondary | ICD-10-CM | POA: Diagnosis not present

## 2024-04-03 DIAGNOSIS — R490 Dysphonia: Secondary | ICD-10-CM | POA: Diagnosis not present

## 2024-04-05 DIAGNOSIS — G894 Chronic pain syndrome: Secondary | ICD-10-CM | POA: Diagnosis not present

## 2024-04-05 DIAGNOSIS — G8929 Other chronic pain: Secondary | ICD-10-CM | POA: Diagnosis not present

## 2024-04-05 DIAGNOSIS — M5137 Other intervertebral disc degeneration, lumbosacral region with discogenic back pain only: Secondary | ICD-10-CM | POA: Diagnosis not present

## 2024-04-05 DIAGNOSIS — Z79891 Long term (current) use of opiate analgesic: Secondary | ICD-10-CM | POA: Diagnosis not present

## 2024-04-05 DIAGNOSIS — R52 Pain, unspecified: Secondary | ICD-10-CM | POA: Diagnosis not present

## 2024-04-05 DIAGNOSIS — M79641 Pain in right hand: Secondary | ICD-10-CM | POA: Diagnosis not present

## 2024-04-05 DIAGNOSIS — M545 Low back pain, unspecified: Secondary | ICD-10-CM | POA: Diagnosis not present

## 2024-05-03 DIAGNOSIS — G894 Chronic pain syndrome: Secondary | ICD-10-CM | POA: Diagnosis not present

## 2024-05-03 DIAGNOSIS — Z79891 Long term (current) use of opiate analgesic: Secondary | ICD-10-CM | POA: Diagnosis not present

## 2024-05-14 DIAGNOSIS — M545 Low back pain, unspecified: Secondary | ICD-10-CM | POA: Diagnosis not present

## 2024-05-14 DIAGNOSIS — G894 Chronic pain syndrome: Secondary | ICD-10-CM | POA: Diagnosis not present

## 2024-05-14 DIAGNOSIS — M25511 Pain in right shoulder: Secondary | ICD-10-CM | POA: Diagnosis not present

## 2024-05-14 DIAGNOSIS — M79641 Pain in right hand: Secondary | ICD-10-CM | POA: Diagnosis not present

## 2024-06-06 DIAGNOSIS — Z6823 Body mass index (BMI) 23.0-23.9, adult: Secondary | ICD-10-CM | POA: Diagnosis not present

## 2024-06-06 DIAGNOSIS — M159 Polyosteoarthritis, unspecified: Secondary | ICD-10-CM | POA: Diagnosis not present

## 2024-06-06 DIAGNOSIS — M069 Rheumatoid arthritis, unspecified: Secondary | ICD-10-CM | POA: Diagnosis not present

## 2024-06-06 DIAGNOSIS — G629 Polyneuropathy, unspecified: Secondary | ICD-10-CM | POA: Diagnosis not present

## 2024-06-06 DIAGNOSIS — I10 Essential (primary) hypertension: Secondary | ICD-10-CM | POA: Diagnosis not present

## 2024-06-06 DIAGNOSIS — C220 Liver cell carcinoma: Secondary | ICD-10-CM | POA: Diagnosis not present

## 2024-06-06 DIAGNOSIS — K7460 Unspecified cirrhosis of liver: Secondary | ICD-10-CM | POA: Diagnosis not present

## 2024-06-06 DIAGNOSIS — R490 Dysphonia: Secondary | ICD-10-CM | POA: Diagnosis not present

## 2024-06-11 DIAGNOSIS — M545 Low back pain, unspecified: Secondary | ICD-10-CM | POA: Diagnosis not present

## 2024-06-11 DIAGNOSIS — G894 Chronic pain syndrome: Secondary | ICD-10-CM | POA: Diagnosis not present

## 2024-06-11 DIAGNOSIS — M25511 Pain in right shoulder: Secondary | ICD-10-CM | POA: Diagnosis not present

## 2024-06-21 DIAGNOSIS — F17200 Nicotine dependence, unspecified, uncomplicated: Secondary | ICD-10-CM | POA: Diagnosis not present

## 2024-06-21 DIAGNOSIS — C220 Liver cell carcinoma: Secondary | ICD-10-CM | POA: Diagnosis not present

## 2024-06-21 DIAGNOSIS — G629 Polyneuropathy, unspecified: Secondary | ICD-10-CM | POA: Diagnosis not present

## 2024-06-21 DIAGNOSIS — J069 Acute upper respiratory infection, unspecified: Secondary | ICD-10-CM | POA: Diagnosis not present

## 2024-06-21 DIAGNOSIS — R490 Dysphonia: Secondary | ICD-10-CM | POA: Diagnosis not present

## 2024-06-21 DIAGNOSIS — I10 Essential (primary) hypertension: Secondary | ICD-10-CM | POA: Diagnosis not present

## 2024-06-21 DIAGNOSIS — Z6822 Body mass index (BMI) 22.0-22.9, adult: Secondary | ICD-10-CM | POA: Diagnosis not present

## 2024-06-21 DIAGNOSIS — K7460 Unspecified cirrhosis of liver: Secondary | ICD-10-CM | POA: Diagnosis not present

## 2024-06-21 DIAGNOSIS — M069 Rheumatoid arthritis, unspecified: Secondary | ICD-10-CM | POA: Diagnosis not present

## 2024-06-21 DIAGNOSIS — M159 Polyosteoarthritis, unspecified: Secondary | ICD-10-CM | POA: Diagnosis not present

## 2024-07-09 DIAGNOSIS — M545 Low back pain, unspecified: Secondary | ICD-10-CM | POA: Diagnosis not present

## 2024-07-09 DIAGNOSIS — M25511 Pain in right shoulder: Secondary | ICD-10-CM | POA: Diagnosis not present

## 2024-07-09 DIAGNOSIS — M79641 Pain in right hand: Secondary | ICD-10-CM | POA: Diagnosis not present

## 2024-08-06 DIAGNOSIS — M545 Low back pain, unspecified: Secondary | ICD-10-CM | POA: Diagnosis not present

## 2024-08-06 DIAGNOSIS — M25511 Pain in right shoulder: Secondary | ICD-10-CM | POA: Diagnosis not present

## 2024-08-06 DIAGNOSIS — M549 Dorsalgia, unspecified: Secondary | ICD-10-CM | POA: Diagnosis not present

## 2024-08-30 DIAGNOSIS — R490 Dysphonia: Secondary | ICD-10-CM | POA: Diagnosis not present

## 2024-08-30 DIAGNOSIS — I10 Essential (primary) hypertension: Secondary | ICD-10-CM | POA: Diagnosis not present

## 2024-08-30 DIAGNOSIS — M159 Polyosteoarthritis, unspecified: Secondary | ICD-10-CM | POA: Diagnosis not present

## 2024-08-30 DIAGNOSIS — C220 Liver cell carcinoma: Secondary | ICD-10-CM | POA: Diagnosis not present

## 2024-08-30 DIAGNOSIS — F17200 Nicotine dependence, unspecified, uncomplicated: Secondary | ICD-10-CM | POA: Diagnosis not present

## 2024-08-30 DIAGNOSIS — Z6823 Body mass index (BMI) 23.0-23.9, adult: Secondary | ICD-10-CM | POA: Diagnosis not present

## 2024-08-30 DIAGNOSIS — K7460 Unspecified cirrhosis of liver: Secondary | ICD-10-CM | POA: Diagnosis not present

## 2024-08-30 DIAGNOSIS — G629 Polyneuropathy, unspecified: Secondary | ICD-10-CM | POA: Diagnosis not present

## 2024-08-30 DIAGNOSIS — M069 Rheumatoid arthritis, unspecified: Secondary | ICD-10-CM | POA: Diagnosis not present

## 2024-09-03 DIAGNOSIS — M79641 Pain in right hand: Secondary | ICD-10-CM | POA: Diagnosis not present

## 2024-09-03 DIAGNOSIS — M545 Low back pain, unspecified: Secondary | ICD-10-CM | POA: Diagnosis not present

## 2024-09-03 DIAGNOSIS — M25511 Pain in right shoulder: Secondary | ICD-10-CM | POA: Diagnosis not present

## 2024-09-05 DIAGNOSIS — M25511 Pain in right shoulder: Secondary | ICD-10-CM | POA: Diagnosis not present

## 2024-09-05 DIAGNOSIS — M545 Low back pain, unspecified: Secondary | ICD-10-CM | POA: Diagnosis not present

## 2024-09-05 DIAGNOSIS — G8929 Other chronic pain: Secondary | ICD-10-CM | POA: Diagnosis not present

## 2024-09-13 ENCOUNTER — Encounter (HOSPITAL_COMMUNITY): Payer: Self-pay | Admitting: Emergency Medicine

## 2024-09-13 ENCOUNTER — Emergency Department (HOSPITAL_COMMUNITY)
Admission: EM | Admit: 2024-09-13 | Discharge: 2024-09-13 | Disposition: A | Discharge status: Home / Self Care | Attending: Emergency Medicine | Admitting: Emergency Medicine

## 2024-09-13 ENCOUNTER — Other Ambulatory Visit: Payer: Self-pay

## 2024-09-13 DIAGNOSIS — I10 Essential (primary) hypertension: Secondary | ICD-10-CM | POA: Insufficient documentation

## 2024-09-13 DIAGNOSIS — K7030 Alcoholic cirrhosis of liver without ascites: Secondary | ICD-10-CM | POA: Insufficient documentation

## 2024-09-13 DIAGNOSIS — N3000 Acute cystitis without hematuria: Secondary | ICD-10-CM | POA: Insufficient documentation

## 2024-09-13 DIAGNOSIS — R252 Cramp and spasm: Secondary | ICD-10-CM | POA: Insufficient documentation

## 2024-09-13 DIAGNOSIS — Z7982 Long term (current) use of aspirin: Secondary | ICD-10-CM | POA: Insufficient documentation

## 2024-09-13 DIAGNOSIS — D696 Thrombocytopenia, unspecified: Secondary | ICD-10-CM | POA: Insufficient documentation

## 2024-09-13 DIAGNOSIS — Z79899 Other long term (current) drug therapy: Secondary | ICD-10-CM | POA: Insufficient documentation

## 2024-09-13 DIAGNOSIS — D649 Anemia, unspecified: Secondary | ICD-10-CM | POA: Insufficient documentation

## 2024-09-13 LAB — MAGNESIUM: Magnesium: 1.8 mg/dL (ref 1.7–2.4)

## 2024-09-13 LAB — URINALYSIS, ROUTINE W REFLEX MICROSCOPIC
Bilirubin Urine: NEGATIVE
Glucose, UA: NEGATIVE mg/dL
Hgb urine dipstick: NEGATIVE
Ketones, ur: NEGATIVE mg/dL
Nitrite: NEGATIVE
Protein, ur: NEGATIVE mg/dL
Specific Gravity, Urine: 1.011 (ref 1.005–1.030)
pH: 5 (ref 5.0–8.0)

## 2024-09-13 LAB — COMPREHENSIVE METABOLIC PANEL WITH GFR
ALT: 10 U/L (ref 0–44)
AST: 25 U/L (ref 15–41)
Albumin: 4.4 g/dL (ref 3.5–5.0)
Alkaline Phosphatase: 73 U/L (ref 38–126)
Anion gap: 12 (ref 5–15)
BUN: 30 mg/dL — ABNORMAL HIGH (ref 8–23)
CO2: 24 mmol/L (ref 22–32)
Calcium: 9.6 mg/dL (ref 8.9–10.3)
Chloride: 98 mmol/L (ref 98–111)
Creatinine, Ser: 1.23 mg/dL (ref 0.61–1.24)
GFR, Estimated: >60 mL/min (ref >60)
Glucose, Bld: 91 mg/dL (ref 70–99)
Potassium: 4.1 mmol/L (ref 3.5–5.1)
Sodium: 134 mmol/L — ABNORMAL LOW (ref 135–145)
Total Bilirubin: 0.3 mg/dL (ref 0.0–1.2)
Total Protein: 7.6 g/dL (ref 6.5–8.1)

## 2024-09-13 LAB — CBC WITH DIFFERENTIAL/PLATELET
Abs Immature Granulocytes: 0.01 K/uL (ref 0.00–0.07)
Basophils Absolute: 0 K/uL (ref 0.0–0.1)
Basophils Relative: 0 %
Eosinophils Absolute: 0.1 K/uL (ref 0.0–0.5)
Eosinophils Relative: 2 %
HCT: 30.2 % — ABNORMAL LOW (ref 39.0–52.0)
Hemoglobin: 10 g/dL — ABNORMAL LOW (ref 13.0–17.0)
Immature Granulocytes: 0 %
Lymphocytes Relative: 16 %
Lymphs Abs: 0.8 K/uL (ref 0.7–4.0)
MCH: 30 pg (ref 26.0–34.0)
MCHC: 33.1 g/dL (ref 30.0–36.0)
MCV: 90.7 fL (ref 80.0–100.0)
Monocytes Absolute: 0.4 K/uL (ref 0.1–1.0)
Monocytes Relative: 8 %
Neutro Abs: 3.7 K/uL (ref 1.7–7.7)
Neutrophils Relative %: 74 %
Platelets: 89 K/uL — ABNORMAL LOW (ref 150–400)
RBC: 3.33 MIL/uL — ABNORMAL LOW (ref 4.22–5.81)
RDW: 13 % (ref 11.5–15.5)
Smear Review: NORMAL
WBC: 5 K/uL (ref 4.0–10.5)
nRBC: 0 % (ref 0.0–0.2)

## 2024-09-13 LAB — PROTIME-INR
INR: 1 (ref 0.8–1.2)
Prothrombin Time: 14.1 s (ref 11.4–15.2)

## 2024-09-13 LAB — LIPASE, BLOOD: Lipase: 39 U/L (ref 11–51)

## 2024-09-13 MED ORDER — CYCLOBENZAPRINE HCL 10 MG PO TABS: 10.0000 mg | ORAL_TABLET | Freq: Two times a day (BID) | ORAL | 0 refills | Status: AC | PRN

## 2024-09-13 MED ORDER — LACTATED RINGERS IV BOLUS
500.0000 mL | Freq: Once | INTRAVENOUS | Status: AC
  Administered 2024-09-13: 500 mL via INTRAVENOUS

## 2024-09-13 MED ORDER — CEFUROXIME AXETIL 250 MG PO TABS: 250.0000 mg | ORAL_TABLET | Freq: Two times a day (BID) | ORAL | 0 refills | Status: AC

## 2024-09-13 NOTE — ED Triage Notes (Signed)
 BIB EMS from home for abdominal cramping. Pt states he is in liver failure and this pain is daily however today is worse than normal and not responsive to his normal prn oxy.

## 2024-09-13 NOTE — ED Provider Notes (Signed)
 [place] EMERGENCY DEPARTMENT AT [facility] Provider Note   CSN: [id number] Arrival date & time: 09/13/24  8365     Patient presents with: muscle cramps   Fernando Ryan is a 73 y.o. male.   HPI   Patient has a history of hypertension cirrhosis.  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  He stopped drinking a while back.  Patient states he has not seen his liver doctor in a while because he was told there is nothing else they can do.  Patient reports trouble with cramps ongoing for over 6 months.  Patient states he will get it throughout his entire body.  It does involve his stomach and his back but it also involves his arms and legs and fingers.  Patient states he can last hours at a time.  He has never seen anyone for this.  Today it was worse so he came to the ED.  He denies any vomiting or diarrhea.  He currently is not having any chest pain or abdominal pain.  He does not have any shortness of breath.  No fevers  Prior to Admission medications   Medication Sig Start Date End Date Taking? Authorizing Provider  cefUROXime (CEFTIN) 250 MG tablet Take 1 tablet (250 mg total) by mouth 2 (two) times daily with a meal for 7 days. 09/13/24 09/20/24 Yes Randol Simmonds, MD  cyclobenzaprine (FLEXERIL) 10 MG tablet Take 1 tablet (10 mg total) by mouth 2 (two) times daily as needed for muscle spasms. 09/13/24  Yes Randol Simmonds, MD  acetaminophen  (TYLENOL ) 500 MG tablet Take 1,000 mg by mouth every 6 (six) hours as needed for moderate pain.    [provider]  aspirin EC 81 MG tablet Take 81 mg by mouth daily. Swallow whole.    [provider]  diclofenac  Sodium (VOLTAREN ) 1 % GEL Apply 1 Application topically 4 (four) times daily as needed (pain).    [provider]  gabapentin  (NEURONTIN ) 300 MG capsule Take 300 mg by mouth 3 (three) times daily. 10/18/21   [provider]  lisinopril  (ZESTRIL ) 10 MG tablet Take 10 mg by mouth daily.     [provider]  Multiple Vitamin (MULTIVITAMIN WITH MINERALS) TABS tablet Take 1 tablet by mouth daily.    [provider]  ondansetron  (ZOFRAN  ODT) 4 MG disintegrating tablet 4mg  ODT q4 hours prn nausea/vomit Patient taking differently: Take 4 mg by mouth every 4 (four) hours as needed for nausea or vomiting (DISSOLVE ORALLY). 02/03/21   Emil Share, DO  oxyCODONE  (ROXICODONE ) 15 MG immediate release tablet Take 15-22.5 mg by mouth 5 (five) times daily as needed for pain.    [provider]    Allergies: Patient has no known allergies.    Review of Systems  Updated Vital Signs BP (!) 141/69 (BP Location: Right Arm)   Pulse 77   Temp (!) 97.5 F (36.4 C) (Oral)   Resp 17   SpO2 100%   Physical Exam Vitals and nursing note reviewed.  Constitutional:      Appearance: He is well-developed. He is not diaphoretic.     Comments: Chronically ill-appearing  HENT:     Head: Normocephalic and atraumatic.     Right Ear: External ear normal.     Left Ear: External ear normal.  Eyes:     General: No scleral icterus.       Right eye: No discharge.        Left eye:  No discharge.     Conjunctiva/sclera: Conjunctivae normal.  Neck:     Trachea: No tracheal deviation.  Cardiovascular:     Rate and Rhythm: Normal rate and regular rhythm.  Pulmonary:     Effort: Pulmonary effort is normal. No respiratory distress.     Breath sounds: Normal breath sounds. No stridor. No wheezing or rales.  Abdominal:     General: Bowel sounds are normal. There is no distension.     Palpations: Abdomen is soft.     Tenderness: There is no abdominal tenderness. There is no guarding or rebound.  Musculoskeletal:        General: No tenderness or deformity.     Cervical back: Neck supple.     Right lower leg: No edema.     Left lower leg: No edema.     Comments: Extremities are warm and well-perfused, no focal swelling or erythema noted, no muscle fasciculations noted on exam   Skin:    General: Skin is warm and dry.     Findings: No rash.  Neurological:     General: No focal deficit present.     Mental Status: He is alert.     Cranial Nerves: No cranial nerve deficit, dysarthria or facial asymmetry.     Sensory: No sensory deficit.     Motor: No abnormal muscle tone or seizure activity.     Coordination: Coordination normal.  Psychiatric:        Mood and Affect: Mood normal.     (all labs ordered are listed, but only abnormal results are displayed) Labs Reviewed  COMPREHENSIVE METABOLIC PANEL WITH GFR - Abnormal; Notable for the following components:      Result Value   Sodium 134 (*)    BUN 30 (*)    All other components within normal limits  CBC WITH DIFFERENTIAL/PLATELET - Abnormal; Notable for the following components:   RBC 3.33 (*)    Hemoglobin 10.0 (*)    HCT 30.2 (*)    Platelets 89 (*)    All other components within normal limits  URINALYSIS, ROUTINE W REFLEX MICROSCOPIC - Abnormal; Notable for the following components:   APPearance HAZY (*)    Leukocytes,Ua LARGE (*)    Bacteria, UA FEW (*)    All other components within normal limits  LIPASE, BLOOD  PROTIME-INR  MAGNESIUM    EKG: None  Radiology: No results found.   Procedures   Medications Ordered in the ED  lactated ringers  bolus 500 mL (500 mLs Intravenous New Bag/Given 09/13/24 1711)    Clinical Course as of 09/13/24 1916  Fri Sep 13, 2024  1747 CBC with Diff(!) Anemia noted stable compared to previous [JK]  1747 CBC with Diff(!) Thrombocytopenia is chronic [JK]  1748 Comprehensive metabolic panel(!) Sodium slightly decreased [JK]  1846 Urinalysis, Routine w reflex microscopic -Urine, Clean Catch(!) Urinalysis suggest possible UTI.  Magnesium normal lipase normal. [JK]    Clinical Course User Index [JK] Randol Simmonds, MD                                 Medical Decision Making Problems Addressed: Acute cystitis without hematuria: acute illness or injury  that poses a threat to life or bodily functions Muscle cramp: acute illness or injury that poses a threat to life or bodily functions  Amount and/or Complexity of Data Reviewed Labs: ordered. Decision-making details documented in ED Course.  Risk Prescription  drug management.   Patient presented to the ED with complaints of muscle cramping ongoing for the last 6 months.  Patient does have history of liver disease.  Initial triage note reported abdominal pain however patient denying any abdominal pain to me.  He has no focal tenderness on exam.  There is no distention.  Patient was reexamined several times during [place] denying any abdominal pain.  Laboratory test do not show any signs of any significant metabolic abnormalities.  No hypokalemia.  No hypercalcemia.  Urinalysis does suggest possible UTI.  Unclear etiology as to the source of his muscle spasms over the last 6 months but at this time no signs of systemic infection, vascular compromise, or significant metabolic abnormalities.  Recommend outpatient follow-up with PCP for further evaluation  Evaluation and diagnostic testing in the emergency department does not suggest an emergent condition requiring admission or immediate intervention beyond what has been performed at this time.  The patient is safe for discharge and has been instructed to return immediately for worsening symptoms, change in symptoms or any other concerns.     Final diagnoses:  Muscle cramp  Acute cystitis without hematuria    ED Discharge Orders          Ordered    cefUROXime (CEFTIN) 250 MG tablet  2 times daily with meals        09/13/24 1915    cyclobenzaprine (FLEXERIL) 10 MG tablet  2 times daily PRN        09/13/24 1915               Randol Simmonds, MD 09/13/24 1925

## 2024-09-13 NOTE — Discharge Instructions (Signed)
 Take the medications as needed for muscle cramps.  Take the antibiotics as prescribed for the possible urinary tract infection.  Follow-up with your doctor to be rechecked
